# Patient Record
Sex: Female | Born: 1985
Health system: Southern US, Community
[De-identification: ages and names within clinical notes are randomized; demographics above are authoritative.]

## PROBLEM LIST (undated history)

## (undated) ENCOUNTER — Inpatient Hospital Stay (HOSPITAL_COMMUNITY): Payer: Self-pay

## (undated) DIAGNOSIS — R51 Headache: Secondary | ICD-10-CM

## (undated) DIAGNOSIS — D62 Acute posthemorrhagic anemia: Secondary | ICD-10-CM

## (undated) DIAGNOSIS — K589 Irritable bowel syndrome without diarrhea: Secondary | ICD-10-CM

## (undated) DIAGNOSIS — Z789 Other specified health status: Secondary | ICD-10-CM

## (undated) DIAGNOSIS — R519 Headache, unspecified: Secondary | ICD-10-CM

## (undated) HISTORY — DX: Headache: R51

## (undated) HISTORY — DX: Headache, unspecified: R51.9

## (undated) HISTORY — PX: NO PAST SURGERIES: SHX2092

## (undated) HISTORY — DX: Irritable bowel syndrome without diarrhea: K58.9

---

## 2005-05-16 ENCOUNTER — Encounter: Admission: RE | Admit: 2005-05-16 | Discharge: 2005-05-16 | Payer: Self-pay | Admitting: Gastroenterology

## 2013-01-21 ENCOUNTER — Ambulatory Visit: Payer: Self-pay | Admitting: Specialist

## 2015-04-10 LAB — OB RESULTS CONSOLE ANTIBODY SCREEN: Antibody Screen: NEGATIVE

## 2015-04-10 LAB — OB RESULTS CONSOLE RPR: RPR: NONREACTIVE

## 2015-04-10 LAB — OB RESULTS CONSOLE ABO/RH: RH TYPE: POSITIVE

## 2015-04-10 LAB — OB RESULTS CONSOLE RUBELLA ANTIBODY, IGM
RUBELLA: IMMUNE
Rubella: IMMUNE
Rubella: NON-IMMUNE/NOT IMMUNE

## 2015-04-10 LAB — OB RESULTS CONSOLE HEPATITIS B SURFACE ANTIGEN: Hepatitis B Surface Ag: NEGATIVE

## 2015-04-10 LAB — OB RESULTS CONSOLE HIV ANTIBODY (ROUTINE TESTING): HIV: NONREACTIVE

## 2015-04-14 LAB — OB RESULTS CONSOLE GC/CHLAMYDIA
Chlamydia: NEGATIVE
Gonorrhea: NEGATIVE

## 2015-10-07 LAB — OB RESULTS CONSOLE GBS: STREP GROUP B AG: NEGATIVE

## 2015-10-23 ENCOUNTER — Inpatient Hospital Stay (HOSPITAL_COMMUNITY)
Admission: AD | Admit: 2015-10-23 | Discharge: 2015-10-24 | DRG: 775 | Disposition: A | Discharge status: Home / Self Care | Payer: BLUE CROSS/BLUE SHIELD | Source: Ambulatory Visit | Attending: Obstetrics and Gynecology | Admitting: Obstetrics and Gynecology

## 2015-10-23 ENCOUNTER — Encounter (HOSPITAL_COMMUNITY): Payer: Self-pay

## 2015-10-23 DIAGNOSIS — Z3A38 38 weeks gestation of pregnancy: Secondary | ICD-10-CM

## 2015-10-23 DIAGNOSIS — D6959 Other secondary thrombocytopenia: Secondary | ICD-10-CM | POA: Diagnosis present

## 2015-10-23 DIAGNOSIS — O9081 Anemia of the puerperium: Secondary | ICD-10-CM | POA: Diagnosis present

## 2015-10-23 DIAGNOSIS — O9912 Other diseases of the blood and blood-forming organs and certain disorders involving the immune mechanism complicating childbirth: Principal | ICD-10-CM | POA: Diagnosis present

## 2015-10-23 DIAGNOSIS — IMO0001 Reserved for inherently not codable concepts without codable children: Secondary | ICD-10-CM

## 2015-10-23 DIAGNOSIS — D62 Acute posthemorrhagic anemia: Secondary | ICD-10-CM | POA: Diagnosis not present

## 2015-10-23 DIAGNOSIS — O99119 Other diseases of the blood and blood-forming organs and certain disorders involving the immune mechanism complicating pregnancy, unspecified trimester: Secondary | ICD-10-CM | POA: Diagnosis present

## 2015-10-23 DIAGNOSIS — D696 Thrombocytopenia, unspecified: Secondary | ICD-10-CM | POA: Diagnosis present

## 2015-10-23 DIAGNOSIS — D509 Iron deficiency anemia, unspecified: Secondary | ICD-10-CM | POA: Diagnosis present

## 2015-10-23 DIAGNOSIS — O99019 Anemia complicating pregnancy, unspecified trimester: Secondary | ICD-10-CM

## 2015-10-23 LAB — CBC
HEMATOCRIT: 33.1 % — AB (ref 36.0–46.0)
Hemoglobin: 10.6 g/dL — ABNORMAL LOW (ref 12.0–15.0)
MCH: 29.1 pg (ref 26.0–34.0)
MCHC: 32 g/dL (ref 30.0–36.0)
MCV: 90.9 fL (ref 78.0–100.0)
Platelets: 136 10*3/uL — ABNORMAL LOW (ref 150–400)
RBC: 3.64 MIL/uL — AB (ref 3.87–5.11)
RDW: 13.6 % (ref 11.5–15.5)
WBC: 6.5 10*3/uL (ref 4.0–10.5)

## 2015-10-23 LAB — RPR: RPR Ser Ql: NONREACTIVE

## 2015-10-23 MED ORDER — ONDANSETRON HCL 4 MG PO TABS: 4.0000 mg | ORAL_TABLET | ORAL | Status: DC | PRN

## 2015-10-23 MED ORDER — LACTATED RINGERS IV SOLN: INTRAVENOUS | Status: DC

## 2015-10-23 MED ORDER — OXYTOCIN 40 UNITS IN LACTATED RINGERS INFUSION - SIMPLE MED
INTRAVENOUS | Status: AC
  Filled 2015-10-23: qty 1000

## 2015-10-23 MED ORDER — OXYTOCIN 40 UNITS IN LACTATED RINGERS INFUSION - SIMPLE MED: 62.5000 mL/h | INTRAVENOUS | Status: DC

## 2015-10-23 MED ORDER — FERROUS SULFATE 325 (65 FE) MG PO TABS
325.0000 mg | ORAL_TABLET | Freq: Two times a day (BID) | ORAL | Status: DC
  Administered 2015-10-23 – 2015-10-24 (×3): 325 mg via ORAL
  Filled 2015-10-23 (×3): qty 1

## 2015-10-23 MED ORDER — PRENATAL MULTIVITAMIN CH
1.0000 | ORAL_TABLET | Freq: Every day | ORAL | Status: DC
  Administered 2015-10-23 – 2015-10-24 (×2): 1 via ORAL
  Filled 2015-10-23 (×2): qty 1

## 2015-10-23 MED ORDER — BUTORPHANOL TARTRATE 1 MG/ML IJ SOLN: 1.0000 mg | INTRAMUSCULAR | Status: DC | PRN

## 2015-10-23 MED ORDER — OXYCODONE-ACETAMINOPHEN 5-325 MG PO TABS: 1.0000 | ORAL_TABLET | ORAL | Status: DC | PRN

## 2015-10-23 MED ORDER — DIPHENHYDRAMINE HCL 25 MG PO CAPS: 25.0000 mg | ORAL_CAPSULE | Freq: Four times a day (QID) | ORAL | Status: DC | PRN

## 2015-10-23 MED ORDER — WITCH HAZEL-GLYCERIN EX PADS: 1.0000 "application " | MEDICATED_PAD | CUTANEOUS | Status: DC | PRN

## 2015-10-23 MED ORDER — OXYTOCIN 10 UNIT/ML IJ SOLN: 10.0000 [IU] | Freq: Once | INTRAMUSCULAR | Status: DC

## 2015-10-23 MED ORDER — DIBUCAINE 1 % RE OINT: 1.0000 "application " | TOPICAL_OINTMENT | RECTAL | Status: DC | PRN

## 2015-10-23 MED ORDER — ONDANSETRON HCL 4 MG/2ML IJ SOLN: 4.0000 mg | INTRAMUSCULAR | Status: DC | PRN

## 2015-10-23 MED ORDER — OXYTOCIN BOLUS FROM INFUSION: 500.0000 mL | INTRAVENOUS | Status: DC

## 2015-10-23 MED ORDER — OXYCODONE-ACETAMINOPHEN 5-325 MG PO TABS: 2.0000 | ORAL_TABLET | ORAL | Status: DC | PRN

## 2015-10-23 MED ORDER — ACETAMINOPHEN 325 MG PO TABS: 650.0000 mg | ORAL_TABLET | ORAL | Status: DC | PRN

## 2015-10-23 MED ORDER — BENZOCAINE-MENTHOL 20-0.5 % EX AERO: 1.0000 "application " | INHALATION_SPRAY | CUTANEOUS | Status: DC | PRN

## 2015-10-23 MED ORDER — SENNOSIDES-DOCUSATE SODIUM 8.6-50 MG PO TABS
2.0000 | ORAL_TABLET | ORAL | Status: DC
  Administered 2015-10-23: 2 via ORAL
  Filled 2015-10-23: qty 2

## 2015-10-23 MED ORDER — IBUPROFEN 600 MG PO TABS
600.0000 mg | ORAL_TABLET | Freq: Four times a day (QID) | ORAL | Status: DC
  Administered 2015-10-23 (×4): 600 mg via ORAL
  Filled 2015-10-23 (×6): qty 1

## 2015-10-23 MED ORDER — SIMETHICONE 80 MG PO CHEW: 80.0000 mg | CHEWABLE_TABLET | ORAL | Status: DC | PRN

## 2015-10-23 MED ORDER — CITRIC ACID-SODIUM CITRATE 334-500 MG/5ML PO SOLN: 30.0000 mL | ORAL | Status: DC | PRN

## 2015-10-23 MED ORDER — ZOLPIDEM TARTRATE 5 MG PO TABS: 5.0000 mg | ORAL_TABLET | Freq: Every evening | ORAL | Status: DC | PRN

## 2015-10-23 MED ORDER — LACTATED RINGERS IV SOLN: 500.0000 mL | INTRAVENOUS | Status: DC | PRN

## 2015-10-23 MED ORDER — LIDOCAINE HCL (PF) 1 % IJ SOLN
INTRAMUSCULAR | Status: AC
  Filled 2015-10-23: qty 30

## 2015-10-23 MED ORDER — LANOLIN HYDROUS EX OINT: TOPICAL_OINTMENT | CUTANEOUS | Status: DC | PRN

## 2015-10-23 MED ORDER — ONDANSETRON HCL 4 MG/2ML IJ SOLN: 4.0000 mg | Freq: Four times a day (QID) | INTRAMUSCULAR | Status: DC | PRN

## 2015-10-23 MED ORDER — LIDOCAINE HCL (PF) 1 % IJ SOLN
30.0000 mL | INTRAMUSCULAR | Status: DC | PRN
  Filled 2015-10-23: qty 30

## 2015-10-23 NOTE — Progress Notes (Signed)
S; notes ctx Rectal pressure  O:  AROM clear fluid VE 8/100/+1  Tracing: baseline 135 (+) accel Ctx q 23mins  IMP: active phase P) cont present mgmt.

## 2015-10-23 NOTE — H&P (Signed)
Lauren Ingram is a 29 y.o. female presenting for active labor. Intact membrane Maternal Medical History:  Reason for admission: Contractions.   Contractions: Onset was 6-12 hours ago.   Frequency: regular.   Perceived severity is moderate.    Fetal activity: Perceived fetal activity is normal.    Prenatal complications: Thrombocytopenia.     OB History    Gravida Para Term Preterm AB TAB SAB Ectopic Multiple Living   5 2 2  0 2 0 2 0 0 2     History reviewed. No pertinent past medical history. History reviewed. No pertinent past surgical history. Family History: family history is not on file. Social History:  reports that she has never smoked. She does not have any smokeless tobacco history on file. Her alcohol and drug histories are not on file.   Prenatal Transfer Tool  Maternal Diabetes: No Genetic Screening: Declined Maternal Ultrasounds/Referrals: Normal Fetal Ultrasounds or other Referrals:  None Maternal Substance Abuse:  No Significant Maternal Medications:  None Significant Maternal Lab Results:  Lab values include: Group B Strep negative Other Comments: gestational thrombocytopenia  Review of Systems  All other systems reviewed and are negative.   Dilation: 5 Effacement (%): 90 Station: 0 Exam by:: Dr Garwin Brothers Blood pressure 118/74, pulse 94, temperature 98 F (36.7 C), temperature source Oral, resp. rate 16, height 5\' 7"  (1.702 m), weight 60.328 kg (133 lb). Maternal Exam:  Uterine Assessment: Contraction strength is moderate.  Contraction frequency is irregular.   Abdomen: Patient reports no abdominal tenderness. Estimated fetal weight is 7lb.   Fetal presentation: vertex  Introitus: Vulva is positive for vulvar varicosities. Amniotic fluid character: not assessed.  Pelvis: adequate for delivery.   Cervix: Cervix evaluated by digital exam.     Physical Exam  Vitals reviewed. Constitutional: She is oriented to person, place, and time. She appears  well-developed and well-nourished.  HENT:  Head: Atraumatic.  Eyes: EOM are normal.  Neck: Neck supple.  Cardiovascular: Normal rate.   Respiratory: Effort normal.  GI: Soft.  Musculoskeletal: She exhibits no edema.  Neurological: She is alert and oriented to person, place, and time.  Skin: Skin is warm and dry.    Prenatal labs: ABO, Rh:  A positive Antibody:  negative Rubella:  Immune RPR:   NR HBsAg:   neg HIV:   neg GBS:   neg  Assessment/Plan: Active labor  hx Gestational thrombocytopenia Term gestation P) admit routine labs. Epidural prn. amniotomy   Zareya Tuckett A 10/23/2015, 1:32 AM

## 2015-10-23 NOTE — MAU Note (Signed)
Pt c/o contractions 5-7 mins apart for several hours now. Denies LOF but having bloody show. Was 1cm in office on Tuesday. +FM

## 2015-10-24 DIAGNOSIS — D62 Acute posthemorrhagic anemia: Secondary | ICD-10-CM | POA: Diagnosis not present

## 2015-10-24 DIAGNOSIS — D509 Iron deficiency anemia, unspecified: Secondary | ICD-10-CM | POA: Diagnosis present

## 2015-10-24 DIAGNOSIS — O99019 Anemia complicating pregnancy, unspecified trimester: Secondary | ICD-10-CM

## 2015-10-24 LAB — CBC
HEMATOCRIT: 26.8 % — AB (ref 36.0–46.0)
HEMOGLOBIN: 8.6 g/dL — AB (ref 12.0–15.0)
MCH: 29.6 pg (ref 26.0–34.0)
MCHC: 32.1 g/dL (ref 30.0–36.0)
MCV: 92.1 fL (ref 78.0–100.0)
Platelets: 122 10*3/uL — ABNORMAL LOW (ref 150–400)
RBC: 2.91 MIL/uL — AB (ref 3.87–5.11)
RDW: 13.9 % (ref 11.5–15.5)
WBC: 4.7 10*3/uL (ref 4.0–10.5)

## 2015-10-24 MED ORDER — FERROUS SULFATE 325 (65 FE) MG PO TABS: 325.0000 mg | ORAL_TABLET | Freq: Every day | ORAL | Status: DC

## 2015-10-24 MED ORDER — IBUPROFEN 600 MG PO TABS: 600.0000 mg | ORAL_TABLET | Freq: Four times a day (QID) | ORAL | Status: DC

## 2015-10-24 NOTE — Lactation Note (Signed)
This note was copied from the chart of Lauren Tyahna Shover. Lactation Consultation Note  Mom states breastfeeding is going very well.  This is her third baby and she nursed previous babies.  Lactation outpatient services and support encouraged prn.  Patient Name: Lauren Ingram [id number] Date: 10/24/2015     Maternal Data    Feeding Feeding Type: Breast Fed  LATCH Score/Interventions Latch: Grasps breast easily, tongue down, lips flanged, rhythmical sucking.  Audible Swallowing: A few with stimulation Intervention(s): Hand expression  Type of Nipple: Everted at rest and after stimulation  Comfort (Breast/Nipple): Soft / non-tender     Hold (Positioning): No assistance needed to correctly position infant at breast.  LATCH Score: 9  Lactation Tools Discussed/Used     Consult Status      Lauren Ingram 10/24/2015, 11:03 AM

## 2015-10-24 NOTE — Discharge Summary (Signed)
Obstetric Discharge Summary Reason for Admission: onset of labor and 38.[redacted] weeks gestation Prenatal Course: gestational thrombocytopenia Intrapartum Procedures: spontaneous vaginal delivery and AROM-clear Postpartum Procedures: none Complications-Operative and Postpartum: ABL anemia HEMOGLOBIN  Date Value Ref Range Status  10/24/2015 8.6* 12.0 - 15.0 g/dL Final   HCT  Date Value Ref Range Status  10/24/2015 26.8* 36.0 - 46.0 % Final    Physical Exam:  General: alert, cooperative and no distress Lochia: appropriate Uterine Fundus: firm Perineum: intact, no edema or bruising DVT Evaluation: No evidence of DVT seen on physical exam. Negative Homan's sign. No cords or calf tenderness. No significant calf/ankle edema.  Discharge Diagnoses: Term Pregnancy-delivered, IDA with compounding ABL anemia  Discharge Information: Date: 10/24/2015 Activity: pelvic rest Diet: routine Medications: PNV, Ibuprofen and Iron Condition: stable Instructions: refer to practice specific booklet Discharge to: home Follow-up Information    Follow up with LaurenSHERONETTE A, MD. Schedule an appointment as soon as possible for Ingram visit in 6 weeks.   Specialty:  Obstetrics and Gynecology   Contact information:   [address] [name] [address] [phone]       Newborn Data: Live born female on 10/23/15 Birth Weight: 6 lb 15.5 oz (3160 g) APGAR: 8, 9  Home with mother.  Lauren Ingram, [place], [place] 10/24/2015, 10:35 AM

## 2015-10-24 NOTE — Progress Notes (Addendum)
PPD #1- SVD  Subjective:   Reports feeling well, desires early discharge Tolerating po/ No nausea or vomiting Bleeding is light Pain controlled with none Up ad lib / ambulatory / voiding without problems Newborn: breastfeeding    Objective:   VS: VS:  Filed Vitals:   10/23/15 0648 10/23/15 1031 10/23/15 1815 10/24/15 0610  BP: 93/57 92/56 99/65  87/51  Pulse: 53 49 67 52  Temp:  97.8 F (36.6 C) 97.9 F (36.6 C) 97.5 F (36.4 C)  TempSrc:  Oral Oral   Resp: 16 18 18 16   Height:      Weight:        LABS:  Recent Labs  10/23/15 0150 10/24/15 0600  WBC 6.5 4.7  HGB 10.6* 8.6*  PLT 136* 122*   Blood type: A/Positive/-- (06/10 0000) Rubella: Immune               I&O: Intake/Output      12/23 0701 - 12/24 0700 12/24 0701 - 12/25 0700   Urine (mL/kg/hr)     Blood     Total Output       Net              Physical Exam: Alert and oriented X3 Abdomen: soft, non-tender, non-distended  Fundus: firm, non-tender, U-4 Perineum: intact, no edema or erythema Lochia: small Extremities: No edema, no calf pain or tenderness   Assessment: PPD #1  [id number]/ S/P:spontaneous vaginal IDA with compounding ABL anemia Gestational thrombocytopenia, delivered-stable Doing well - stable for discharge home  Plan: Discharge home RX's:  Ibuprofen 600mg  po Q 6 hrs prn pain #30 Refill x 0 Niferex 150mg  po QD #30 Refill x 1 Follow up in 6 wks at [facility] for postpartum visit [place] given    Julianne Handler, N MSN, CNM 10/24/2015, 10:32 AM

## 2016-08-25 ENCOUNTER — Encounter: Payer: Self-pay | Admitting: Gastroenterology

## 2016-09-02 ENCOUNTER — Encounter: Payer: Self-pay | Admitting: Gastroenterology

## 2016-09-02 ENCOUNTER — Ambulatory Visit (INDEPENDENT_AMBULATORY_CARE_PROVIDER_SITE_OTHER): Payer: BLUE CROSS/BLUE SHIELD | Admitting: Gastroenterology

## 2016-09-02 ENCOUNTER — Other Ambulatory Visit (INDEPENDENT_AMBULATORY_CARE_PROVIDER_SITE_OTHER): Payer: BLUE CROSS/BLUE SHIELD

## 2016-09-02 VITALS — BP 106/68 | HR 56 | Ht 68.0 in | Wt 136.0 lb

## 2016-09-02 DIAGNOSIS — R1011 Right upper quadrant pain: Secondary | ICD-10-CM

## 2016-09-02 DIAGNOSIS — R140 Abdominal distension (gaseous): Secondary | ICD-10-CM | POA: Diagnosis not present

## 2016-09-02 DIAGNOSIS — K589 Irritable bowel syndrome without diarrhea: Secondary | ICD-10-CM

## 2016-09-02 LAB — HEPATIC FUNCTION PANEL
ALK PHOS: 71 U/L (ref 39–117)
ALT: 12 U/L (ref 0–35)
AST: 14 U/L (ref 0–37)
Albumin: 4.6 g/dL (ref 3.5–5.2)
BILIRUBIN TOTAL: 0.4 mg/dL (ref 0.2–1.2)
Bilirubin, Direct: 0.1 mg/dL (ref 0.0–0.3)
Total Protein: 7.1 g/dL (ref 6.0–8.3)

## 2016-09-02 LAB — IGA: IGA: 170 mg/dL (ref 68–378)

## 2016-09-02 NOTE — Patient Instructions (Signed)
If you are age 32 or older, your body mass index should be between 23-30. Your Body mass index is 20.68 kg/m. If this is out of the aforementioned range listed, please consider follow up with your Primary Care Provider.  If you are age 31 or younger, your body mass index should be between 19-25. Your Body mass index is 20.68 kg/m. If this is out of the aformentioned range listed, please consider follow up with your Primary Care Provider.   Your physician has requested that you go to the basement for lab work before leaving today.  We have given you a Low FODMAP Diet.  You have been scheduled for an abdominal ultrasound at [facility] (1st floor of hospital) on Tuesday November 7th at 8:30am. Please arrive 15 minutes prior to your appointment for registration. Make certain not to have anything to after midnight the night before your appointment. Should you need to reschedule your appointment, please contact radiology at [phone]. This test typically takes about 30 minutes to perform.

## 2016-09-02 NOTE — Progress Notes (Signed)
HPI :  30  Y/o female with history of reported IBS, here for evaluation for RUQ abdominal pain.   Patient reports pain more so since October, although previously has bothered her intermittently.  Pain in the RUQ and can radiate into her shoulder blade and mid back, and epigastric area. She reports she typically will have pain after she eats, but has occurred without eating. She has pain within 1 hour after eating, usually at night, she will feel it. She reports pain usually resolves within an hour or two, but the pain in her back can last a bit longer. Pain can be severe. She reports she can feel this on most days of the week, intense pain about 1-2 times per week. She tries to avoid eating any fatty or greasy foods. Nuts in particular will cause her to feel terrible. She has a lot of bloating / gas after she eats, normally eats a lot of vegetables. No vomiting routinely, but some nausea. No weight loss. No FH of cancer.  Mother had history of gallstones.  She normally has loose stools. She reports roughly 1-2 BMs per day or so. NO blood in the stools. Symptoms stable over time for years. She previously had IBS-C when in college. She is dairy free / grain free which has helped.    Past Medical History:  Diagnosis Date  . IBS (irritable bowel syndrome)   . Postpartum care following vaginal delivery (12/23) 10/23/2015     History reviewed. No pertinent surgical history. Family History  Problem Relation Age of Onset  . Heart disease Maternal Grandfather   . Heart disease Paternal Grandfather    Social History  Substance Use Topics  . Smoking status: Never Smoker  . Smokeless tobacco: Never Used  . Alcohol use No   No current outpatient prescriptions on file.   No current facility-administered medications for this visit.    No Known Allergies   Review of Systems: All systems reviewed and negative except where noted in HPI.   Labs reviewed - history of anemia during  pregnancy  Physical Exam: BP 106/68   Pulse (!) 56   Ht 5\' 8"  (1.727 m)   Wt 136 lb (61.7 kg)   BMI 20.68 kg/m  Constitutional: Pleasant,well-developed, female in no acute distress. HEENT: Normocephalic and atraumatic. Conjunctivae are normal. No scleral icterus. Neck supple.  Cardiovascular: Normal rate, regular rhythm.  Pulmonary/chest: Effort normal and breath sounds normal. No wheezing, rales or rhonchi. Abdominal: Soft, nondistended, mild RUQ TTP, no rebound or guarding. There are no masses palpable. No hepatomegaly. Extremities: no edema Lymphadenopathy: No cervical adenopathy noted. Neurological: Alert and oriented to person place and time. Skin: Skin is warm and dry. No rashes noted. Psychiatric: Normal mood and affect. Behavior is normal.   ASSESSMENT AND PLAN: 30 year old female with history of suspected irritable bowel syndrome presenting with a month's worth of persistent postprandial right upper quadrant discomfort with radiation to the shoulder blade, concerning for biliary colic/gallstones. Recommend ultrasound of the right upper quadrant to initially evaluate this symptom. If gallstones are noted on this exam we will have her evaluated by general surgery for consideration of cholecystectomy. In the interim will obtain LFTs to ensure normal.  Otherwise she has some chronic bloating and intermittent loose stools. I suspect this is more than likely IBS and perhaps related to her diet. We'll send TTG to assess for celiac disease. I otherwise counseled her on a low FODMAP diet and recommend she try this initially to  see if it helps.   All questions answered we will let her know the results of her tests  [place] Cellar, MD [facility] Pager [phone]

## 2016-09-05 LAB — TISSUE TRANSGLUTAMINASE, IGA: TISSUE TRANSGLUTAMINASE AB, IGA: 1 U/mL (ref <4)

## 2016-09-06 ENCOUNTER — Other Ambulatory Visit: Payer: Self-pay

## 2016-09-06 ENCOUNTER — Encounter: Payer: Self-pay | Admitting: Gastroenterology

## 2016-09-06 ENCOUNTER — Ambulatory Visit (HOSPITAL_COMMUNITY): Payer: BLUE CROSS/BLUE SHIELD

## 2016-09-06 NOTE — Progress Notes (Signed)
Letter mailed

## 2016-09-08 ENCOUNTER — Other Ambulatory Visit: Payer: Self-pay

## 2016-09-08 ENCOUNTER — Telehealth: Payer: Self-pay

## 2016-09-08 ENCOUNTER — Ambulatory Visit
Admission: RE | Admit: 2016-09-08 | Discharge: 2016-09-08 | Disposition: A | Discharge status: Home / Self Care | Payer: BLUE CROSS/BLUE SHIELD | Source: Ambulatory Visit | Attending: Gastroenterology | Admitting: Gastroenterology

## 2016-09-08 DIAGNOSIS — R140 Abdominal distension (gaseous): Secondary | ICD-10-CM

## 2016-09-08 DIAGNOSIS — K589 Irritable bowel syndrome without diarrhea: Secondary | ICD-10-CM | POA: Diagnosis present

## 2016-09-08 DIAGNOSIS — R1011 Right upper quadrant pain: Secondary | ICD-10-CM

## 2016-09-08 MED ORDER — OMEPRAZOLE 40 MG PO CPDR: 40.0000 mg | DELAYED_RELEASE_CAPSULE | Freq: Every day | ORAL | 0 refills | Status: DC

## 2016-09-08 NOTE — Telephone Encounter (Signed)
Patient called back, she is still breast feeding. According to radiology she will need to pump and dump for a full 24 hours after the test. She currently does not have enough milk stored, so patient will call scheduling to reschedule into December.

## 2016-09-15 ENCOUNTER — Ambulatory Visit (HOSPITAL_COMMUNITY): Payer: BLUE CROSS/BLUE SHIELD

## 2016-10-19 ENCOUNTER — Ambulatory Visit (HOSPITAL_COMMUNITY): Payer: BLUE CROSS/BLUE SHIELD

## 2016-11-18 ENCOUNTER — Other Ambulatory Visit: Payer: Self-pay

## 2016-11-18 ENCOUNTER — Telehealth: Payer: Self-pay | Admitting: Gastroenterology

## 2016-11-18 DIAGNOSIS — D509 Iron deficiency anemia, unspecified: Secondary | ICD-10-CM

## 2016-11-18 NOTE — Telephone Encounter (Signed)
Patient returning Julie's call °

## 2016-11-21 ENCOUNTER — Other Ambulatory Visit: Payer: Self-pay

## 2016-11-21 ENCOUNTER — Other Ambulatory Visit
Admission: RE | Admit: 2016-11-21 | Discharge: 2016-11-21 | Disposition: A | Discharge status: Home / Self Care | Payer: BLUE CROSS/BLUE SHIELD | Source: Ambulatory Visit | Attending: Gastroenterology | Admitting: Gastroenterology

## 2016-11-21 DIAGNOSIS — D509 Iron deficiency anemia, unspecified: Secondary | ICD-10-CM | POA: Insufficient documentation

## 2016-11-21 LAB — PREGNANCY, URINE: Preg Test, Ur: NEGATIVE

## 2016-11-21 NOTE — Telephone Encounter (Signed)
Spoke to patient, she will go to [facility] today to their outpatient lab to get a urine pregnancy test. Patient does have phone number to radiology scheduling if it is needed and she has to reschedule.

## 2016-11-21 NOTE — Addendum Note (Signed)
Addended by: Doristine Counter on: 11/21/2016 08:36 AM   Modules accepted: Orders

## 2016-11-22 ENCOUNTER — Encounter
Admission: RE | Admit: 2016-11-22 | Discharge: 2016-11-22 | Disposition: A | Discharge status: Home / Self Care | Payer: BLUE CROSS/BLUE SHIELD | Source: Ambulatory Visit | Attending: Gastroenterology | Admitting: Gastroenterology

## 2016-11-22 ENCOUNTER — Telehealth: Payer: Self-pay

## 2016-11-22 DIAGNOSIS — R1011 Right upper quadrant pain: Secondary | ICD-10-CM | POA: Diagnosis present

## 2016-11-22 MED ORDER — TECHNETIUM TC 99M MEBROFENIN IV KIT
5.0000 | PACK | Freq: Once | INTRAVENOUS | Status: AC | PRN
  Administered 2016-11-22: 4.95 via INTRAVENOUS

## 2016-11-22 NOTE — Telephone Encounter (Signed)
-----   Message from Manus Gunning, MD sent at 11/22/2016 12:41 PM EST ----- Caryl Pina can you let this patient know her HIDA scan was normal. Her [place] also did not show gallstones. This would argue against her gallbladder causing her symptoms, although it remains possible.   Has she had any benefit on the omeprazole at all, or have her symptoms persisted? If she is still having pain after she eats despite omeprazole, I would offer her an EGD to further evaluate. If symptoms are controlled with omeprazole she should continue that for now and can hold off on EGD. Can you let me know how she wishes to proceed? Thanks

## 2016-11-22 NOTE — Telephone Encounter (Signed)
Informed pt of normal scan. She states that she continues to have pain in RUQ and right back and that she was having pain during the scan as well. I offered the EGD however pt does not want to have the procedure at this time. She will continue Omeprazole and call with concerns.

## 2016-11-22 NOTE — Telephone Encounter (Signed)
-----   Message from Manus Gunning, MD sent at 11/21/2016  4:58 PM EST ----- Yes, negative, she can proceed. Thanks  ----- Message ----- From: Doristine Counter, RN Sent: 11/21/2016   4:50 PM To: Manus Gunning, MD  Hi, she just called for her pregnancy test results, saw that they are negative. She has a HIDA scan tomorrow and needed to find out if she should go or reschedule. Thanks.

## 2016-11-22 NOTE — Telephone Encounter (Signed)
Patient advised of negative test results, okay to proceed with HIDA.

## 2017-03-08 DIAGNOSIS — Z3A01 Less than 8 weeks gestation of pregnancy: Secondary | ICD-10-CM | POA: Diagnosis not present

## 2017-03-08 DIAGNOSIS — O09291 Supervision of pregnancy with other poor reproductive or obstetric history, first trimester: Secondary | ICD-10-CM | POA: Diagnosis not present

## 2017-03-08 DIAGNOSIS — Z3201 Encounter for pregnancy test, result positive: Secondary | ICD-10-CM | POA: Diagnosis not present

## 2017-03-13 DIAGNOSIS — Z3201 Encounter for pregnancy test, result positive: Secondary | ICD-10-CM | POA: Diagnosis not present

## 2017-03-22 DIAGNOSIS — Z3201 Encounter for pregnancy test, result positive: Secondary | ICD-10-CM | POA: Diagnosis not present

## 2017-03-23 DIAGNOSIS — Z3A01 Less than 8 weeks gestation of pregnancy: Secondary | ICD-10-CM | POA: Diagnosis not present

## 2017-03-23 DIAGNOSIS — Z3689 Encounter for other specified antenatal screening: Secondary | ICD-10-CM | POA: Diagnosis not present

## 2017-03-23 DIAGNOSIS — Z3481 Encounter for supervision of other normal pregnancy, first trimester: Secondary | ICD-10-CM | POA: Diagnosis not present

## 2017-03-23 DIAGNOSIS — O09291 Supervision of pregnancy with other poor reproductive or obstetric history, first trimester: Secondary | ICD-10-CM | POA: Diagnosis not present

## 2017-04-13 DIAGNOSIS — O09291 Supervision of pregnancy with other poor reproductive or obstetric history, first trimester: Secondary | ICD-10-CM | POA: Diagnosis not present

## 2017-04-13 DIAGNOSIS — Z118 Encounter for screening for other infectious and parasitic diseases: Secondary | ICD-10-CM | POA: Diagnosis not present

## 2017-04-13 DIAGNOSIS — Z3A11 11 weeks gestation of pregnancy: Secondary | ICD-10-CM | POA: Diagnosis not present

## 2017-04-24 LAB — OB RESULTS CONSOLE GC/CHLAMYDIA
Chlamydia: NEGATIVE
Gonorrhea: NEGATIVE

## 2017-04-24 LAB — OB RESULTS CONSOLE RPR: RPR: NONREACTIVE

## 2017-04-24 LAB — OB RESULTS CONSOLE HEPATITIS B SURFACE ANTIGEN: HEP B S AG: NEGATIVE

## 2017-04-24 LAB — OB RESULTS CONSOLE HIV ANTIBODY (ROUTINE TESTING): HIV: NONREACTIVE

## 2017-04-24 LAB — OB RESULTS CONSOLE ABO/RH: RH TYPE: POSITIVE

## 2017-04-24 LAB — OB RESULTS CONSOLE ANTIBODY SCREEN: Antibody Screen: NEGATIVE

## 2017-04-24 LAB — OB RESULTS CONSOLE RUBELLA ANTIBODY, IGM: RUBELLA: IMMUNE

## 2017-05-09 DIAGNOSIS — Z3A14 14 weeks gestation of pregnancy: Secondary | ICD-10-CM | POA: Diagnosis not present

## 2017-05-09 DIAGNOSIS — O09292 Supervision of pregnancy with other poor reproductive or obstetric history, second trimester: Secondary | ICD-10-CM | POA: Diagnosis not present

## 2017-05-22 DIAGNOSIS — Z3A16 16 weeks gestation of pregnancy: Secondary | ICD-10-CM | POA: Diagnosis not present

## 2017-05-22 DIAGNOSIS — R350 Frequency of micturition: Secondary | ICD-10-CM | POA: Diagnosis not present

## 2017-05-22 DIAGNOSIS — O09292 Supervision of pregnancy with other poor reproductive or obstetric history, second trimester: Secondary | ICD-10-CM | POA: Diagnosis not present

## 2017-06-07 DIAGNOSIS — Z363 Encounter for antenatal screening for malformations: Secondary | ICD-10-CM | POA: Diagnosis not present

## 2017-06-07 DIAGNOSIS — Z3A18 18 weeks gestation of pregnancy: Secondary | ICD-10-CM | POA: Diagnosis not present

## 2017-06-07 DIAGNOSIS — O4402 Placenta previa specified as without hemorrhage, second trimester: Secondary | ICD-10-CM | POA: Diagnosis not present

## 2017-07-05 DIAGNOSIS — O4402 Placenta previa specified as without hemorrhage, second trimester: Secondary | ICD-10-CM | POA: Diagnosis not present

## 2017-07-05 DIAGNOSIS — Z3A22 22 weeks gestation of pregnancy: Secondary | ICD-10-CM | POA: Diagnosis not present

## 2017-07-26 DIAGNOSIS — O4402 Placenta previa specified as without hemorrhage, second trimester: Secondary | ICD-10-CM | POA: Diagnosis not present

## 2017-07-26 DIAGNOSIS — Z3A25 25 weeks gestation of pregnancy: Secondary | ICD-10-CM | POA: Diagnosis not present

## 2017-08-17 DIAGNOSIS — O4403 Placenta previa specified as without hemorrhage, third trimester: Secondary | ICD-10-CM | POA: Diagnosis not present

## 2017-08-17 DIAGNOSIS — Z23 Encounter for immunization: Secondary | ICD-10-CM | POA: Diagnosis not present

## 2017-08-17 DIAGNOSIS — Z3689 Encounter for other specified antenatal screening: Secondary | ICD-10-CM | POA: Diagnosis not present

## 2017-08-17 DIAGNOSIS — Z3A28 28 weeks gestation of pregnancy: Secondary | ICD-10-CM | POA: Diagnosis not present

## 2017-09-05 ENCOUNTER — Inpatient Hospital Stay (HOSPITAL_COMMUNITY)
Admission: AD | Admit: 2017-09-05 | Discharge: 2017-09-08 | DRG: 833 | Disposition: A | Discharge status: Home / Self Care | Payer: BLUE CROSS/BLUE SHIELD | Source: Ambulatory Visit | Attending: Obstetrics and Gynecology | Admitting: Obstetrics and Gynecology

## 2017-09-05 ENCOUNTER — Encounter (HOSPITAL_COMMUNITY): Payer: Self-pay | Admitting: *Deleted

## 2017-09-05 ENCOUNTER — Other Ambulatory Visit: Payer: Self-pay

## 2017-09-05 ENCOUNTER — Inpatient Hospital Stay (HOSPITAL_COMMUNITY): Payer: BLUE CROSS/BLUE SHIELD

## 2017-09-05 DIAGNOSIS — O4413 Placenta previa with hemorrhage, third trimester: Principal | ICD-10-CM | POA: Diagnosis present

## 2017-09-05 DIAGNOSIS — O4410 Placenta previa with hemorrhage, unspecified trimester: Secondary | ICD-10-CM | POA: Diagnosis not present

## 2017-09-05 DIAGNOSIS — O4403 Placenta previa specified as without hemorrhage, third trimester: Secondary | ICD-10-CM | POA: Diagnosis not present

## 2017-09-05 DIAGNOSIS — Z3A31 31 weeks gestation of pregnancy: Secondary | ICD-10-CM

## 2017-09-05 HISTORY — DX: Other specified health status: Z78.9

## 2017-09-05 LAB — CBC
HEMATOCRIT: 30.6 % — AB (ref 36.0–46.0)
Hemoglobin: 10.2 g/dL — ABNORMAL LOW (ref 12.0–15.0)
MCH: 30.5 pg (ref 26.0–34.0)
MCHC: 33.3 g/dL (ref 30.0–36.0)
MCV: 91.6 fL (ref 78.0–100.0)
PLATELETS: 140 10*3/uL — AB (ref 150–400)
RBC: 3.34 MIL/uL — ABNORMAL LOW (ref 3.87–5.11)
RDW: 13.6 % (ref 11.5–15.5)
WBC: 6.8 10*3/uL (ref 4.0–10.5)

## 2017-09-05 LAB — ABO/RH: ABO/RH(D): A POS

## 2017-09-05 LAB — TYPE AND SCREEN
ABO/RH(D): A POS
Antibody Screen: NEGATIVE

## 2017-09-05 MED ORDER — BETAMETHASONE SOD PHOS & ACET 6 (3-3) MG/ML IJ SUSP
12.0000 mg | INTRAMUSCULAR | Status: AC
  Administered 2017-09-05 – 2017-09-06 (×2): 12 mg via INTRAMUSCULAR
  Filled 2017-09-05 (×2): qty 2

## 2017-09-05 MED ORDER — LACTATED RINGERS IV SOLN
INTRAVENOUS | Status: DC
  Administered 2017-09-05 – 2017-09-06 (×2): via INTRAVENOUS

## 2017-09-05 MED ORDER — DOCUSATE SODIUM 100 MG PO CAPS
100.0000 mg | ORAL_CAPSULE | Freq: Every day | ORAL | Status: DC
  Administered 2017-09-05 – 2017-09-08 (×4): 100 mg via ORAL
  Filled 2017-09-05 (×6): qty 1

## 2017-09-05 MED ORDER — MAGNESIUM SULFATE BOLUS VIA INFUSION
6.0000 g | Freq: Once | INTRAVENOUS | Status: AC
  Administered 2017-09-05: 6 g via INTRAVENOUS
  Filled 2017-09-05: qty 500

## 2017-09-05 MED ORDER — MAGNESIUM SULFATE 40 G IN LACTATED RINGERS - SIMPLE
2.0000 g/h | INTRAVENOUS | Status: DC
  Administered 2017-09-05: 2 g/h via INTRAVENOUS
  Filled 2017-09-05: qty 500

## 2017-09-05 MED ORDER — PRENATAL MULTIVITAMIN CH
1.0000 | ORAL_TABLET | Freq: Every day | ORAL | Status: DC
  Administered 2017-09-05 – 2017-09-08 (×4): 1 via ORAL
  Filled 2017-09-05 (×6): qty 1

## 2017-09-05 MED ORDER — BETAMETHASONE SOD PHOS & ACET 6 (3-3) MG/ML IJ SUSP
12.0000 mg | INTRAMUSCULAR | Status: DC
  Filled 2017-09-05 (×2): qty 2

## 2017-09-05 MED ORDER — LACTATED RINGERS IV SOLN
INTRAVENOUS | Status: DC
  Administered 2017-09-05: 13:00:00 via INTRAVENOUS

## 2017-09-05 MED ORDER — ACETAMINOPHEN 325 MG PO TABS
650.0000 mg | ORAL_TABLET | ORAL | Status: DC | PRN
  Administered 2017-09-06 (×2): 650 mg via ORAL
  Filled 2017-09-05 (×2): qty 2

## 2017-09-05 MED ORDER — CALCIUM CARBONATE ANTACID 500 MG PO CHEW: 2.0000 | CHEWABLE_TABLET | ORAL | Status: DC | PRN

## 2017-09-05 MED ORDER — ZOLPIDEM TARTRATE 5 MG PO TABS: 5.0000 mg | ORAL_TABLET | Freq: Every evening | ORAL | Status: DC | PRN

## 2017-09-05 NOTE — MAU Note (Signed)
Pt presents with c/o VB.  Pt reports she has a placenta previa with this pregnancy.  States has had back pain that worsened throughout the morning.Had a gush of bright red vaginal bleeding, moderate amount, no clots.  States this is 1st bleed.  Reports +FM.

## 2017-09-05 NOTE — H&P (Addendum)
Lauren Ingram is a 31 y.o. female @ 47 1/[redacted] weeks gestation presenting with complaint of painless vaginal bleeding since 11 am. Pt has been on pelvic rest. Known placenta previa with 1st episode of vaginal bleeding (+) FM. Some back pain  OB History    Gravida Para Term Preterm AB Living   6 3 3  0 2 3   SAB TAB Ectopic Multiple Live Births   2 0 0 0 1     Past Medical History:  Diagnosis Date  . IBS (irritable bowel syndrome)   . Medical history non-contributory   . Postpartum care following vaginal delivery (12/23) 10/23/2015   Past Surgical History:  Procedure Laterality Date  . NO PAST SURGERIES     Family History: family history includes Heart disease in her maternal grandfather and paternal grandfather. Social History:  reports that  has never smoked. she has never used smokeless tobacco. She reports that she does not drink alcohol or use drugs.     Maternal Diabetes: No Genetic Screening: Normal Maternal Ultrasounds/Referrals: Normal Fetal Ultrasounds or other Referrals:  None Maternal Substance Abuse:  No Significant Maternal Medications:  None Significant Maternal Lab Results:  Lab values include: Other:  Other Comments:  placenta previa  Review of Systems  All other systems reviewed and are negative.  Maternal Medical History:  Reason for admission: Vaginal bleeding.   Fetal activity: Perceived fetal activity is normal.   Last perceived fetal movement was within the past hour.    Prenatal complications: Bleeding.   Prenatal Complications - Diabetes: none.      Blood pressure 101/64, pulse 65, temperature 97.9 F (36.6 C), temperature source Oral, resp. rate 20, height 5\' 7"  (1.702 m), weight 61.2 kg (135 lb), SpO2 99 %, unknown if currently breastfeeding. Exam Physical Exam  Constitutional: She is oriented to person, place, and time. She appears well-developed and well-nourished. No distress.  HENT:  Head: Atraumatic.  Neck: Neck supple.   Cardiovascular: Regular rhythm.  Respiratory: Breath sounds normal.  GI: Soft.  Genitourinary:  Genitourinary Comments: Vulva: smeared with blood Cervix parous visually closed  (+) clots mucus Uterus gravid nontender  Musculoskeletal: She exhibits edema.  Neurological: She is alert and oriented to person, place, and time.  Skin: Skin is warm and dry.  Psychiatric: She has a normal mood and affect.   tracing: baseline 130 accel to 150 no ctx Prenatal labs: ABO, Rh:  A positive Antibody:  neg Rubella:  immune RPR:   NR HBsAg:   neg HIV:   neg GBS:   today  Assessment/Plan: 3rd trimester vaginal bleeding due to placenta previa Placenta Previa IUP @ 31 1/7 weeks P) admit routine labs. sono for EFW. BMZ. Magnesium for neuro prophylaxis. GBS culture. NICU consult  Continuous fetal monitoring Nuri Larmer A 09/05/2017, 1:48 PM

## 2017-09-05 NOTE — MAU Note (Signed)
Urine sent to lab 

## 2017-09-05 NOTE — MAU Note (Signed)
Pt reports she awakened with lower back pain this am, worsening over the morning. Reports she had a gush of blood, has history of previa.

## 2017-09-06 NOTE — Progress Notes (Signed)
HD #2 31 2/7 weeks Placenta previa C/o feeling drunk from the magnesium (+) FM Notes bleeding has decreased. " less gushing" Back pain resolved  O; BP (!) 90/51 (BP Location: Right Arm) Comment: notified nurse  Pulse 73   Temp (!) 97.5 F (36.4 C) (Oral)   Resp 17   Ht 5\' 7"  (1.702 m)   Wt 61.2 kg (135 lb)   SpO2 97%   BMI 21.14 kg/m  Lungs clear to A  Cor RRR Abdomen: gravid nontender Pelvic: pad: dark blood Extr: no edema or calf tenderness  Tracing: baseline 120 (+) accel to 140 no ctx  [place] Mfm Ob Comp + 14 Wk  Result Date: 09/05/2017 ----------------------------------------------------------------------  OBSTETRICS REPORT                      (Signed Final 09/05/2017 03:33 pm) ---------------------------------------------------------------------- Patient Info  ID #:       [id number]                          D.O.B.:  [birth year] (31 yrs)  Name:       Lauren Ingram                  Visit Date: 09/05/2017 02:58 pm ---------------------------------------------------------------------- Performed By  Performed By:     Jeanene Erb BS,      Ref. Address:     [address] [place]                                                             [place], [place]  Attending:        Griffin Dakin MD  Secondary Phy.:   3rd Nursing- HR                                                             OB                                                             3rd Floor  Referred By:      Alanda Slim             Location:         [facility]                    Otelia Hettinger MD ---------------------------------------------------------------------- Orders   #  Description                                 Code   1   [place] MFM OB COMP + 42 WK                      76805.01  ----------------------------------------------------------------------   #  Ordered By               Order #        Accession #    Episode #   1  Alanda Slim               [id number]      [id number]     [id number]      Marice Guidone  ---------------------------------------------------------------------- Indications   [redacted] weeks gestation of pregnancy                Z3A.31   Encounter for antenatal screening for          Z36.3   malformations   Placenta previa with hemorrhage, third         O44.13   trimester   Thrombocytopenia affecting pregnancy,          O99.119, D69.6   antepartum  ---------------------------------------------------------------------- OB History  Blood Type:            Height:  5'7"   Weight (lb):  135       BMI:  21.14  Gravidity:    6         Term:   3        Prem:   0        SAB:   2  TOP:          0       Ectopic:  0        Living: 3 ---------------------------------------------------------------------- Fetal Evaluation  Num Of Fetuses:     1  Fetal Heart         143  Rate(bpm):  Cardiac Activity:   Observed  Presentation:       Cephalic  Placenta:           Asymmetric Complete Previa  P. Cord Insertion:  Not well visualized  Amniotic Fluid  AFI FV:      Subjectively within normal  limits  AFI Sum(cm)     %Tile       Largest Pocket(cm)  15.87           57          7.04  RUQ(cm)       RLQ(cm)       LUQ(cm)        LLQ(cm)  7.04          1.6           4.26           2.97 ---------------------------------------------------------------------- Biometry  BPD:      77.6  mm     G. Age:  31w 1d         39  %    CI:        74.85   %    70 - 86                                                          FL/HC:      21.2   %    19.3 - 21.3  HC:      284.6  mm     G. Age:  31w 2d         18  %    HC/AC:      0.98        0.96 - 1.17  AC:      289.2  mm     G. Age:  32w 6d         90  %    FL/BPD:     77.8   %    71 - 87  FL:       60.4  mm     G. Age:  31w 3d          4  %    FL/AC:      20.9   %    20 - 24  Est. FW:    1912  gm      4 lb 3 oz     75  % ---------------------------------------------------------------------- Gestational Age  Clinical EDD:  31w 1d                                        EDD:   11/06/17  U/S Today:     31w 5d                                        EDD:   11/02/17  Best:          31w 1d     Det. By:  Clinical EDD             EDD:   11/06/17 ---------------------------------------------------------------------- Anatomy  Cranium:               Appears normal         Aortic Arch:            Not well visualized  Cavum:  Appears normal         Ductal Arch:            Not well visualized  Ventricles:            Appears normal         Diaphragm:              Appears normal  Choroid Plexus:        Appears normal         Stomach:                Appears normal, left                                                                        sided  Cerebellum:            Appears normal         Abdomen:                Appears normal  Posterior Fossa:       Appears normal         Abdominal Wall:         Not well visualized  Nuchal Fold:           Not applicable (>96    Cord Vessels:           Appears normal ([redacted]                         wks GA)                                        vessel cord)  Face:                  Appears normal         Kidneys:                Appear normal                         (orbits and profile)  Lips:                  Appears normal         Bladder:                Appears normal  Thoracic:              Appears normal         Spine:                  Limited views                                                                        appear normal  Heart:  Appears normal         Upper Extremities:      Not well visualized                         (4CH, axis, and situs  RVOT:                  Appears normal         Lower Extremities:      Not well visualized  LVOT:                  Appears normal  Other:  Parents do  not wish to know sex of fetus. Complete fetal anatomic          survey previously performed in office. Technically difficult due to          advanced GA and fetal position. ---------------------------------------------------------------------- Cervix Uterus Adnexa  Cervix  Not visualized (advanced GA >29wks) ---------------------------------------------------------------------- Impression  Singleton intrauterine pregnancy at 31+1 weeks with known  previa  Review of the anatomy shows no sonographic markers for  aneuploidy or structural anomalies  However, aortic arch and distal extremity evaluations should  be considered suboptimal secondary to late gestational age  and fetal position  Amniotic fluid volume is normal with an AFI of 15.9 cm  Estimated fetal weight is 1912g which is growth in the 75th  percentile ---------------------------------------------------------------------- Recommendations  Continue clinical evaluation and management ----------------------------------------------------------------------                 Griffin Dakin, MD Electronically Signed Final Report   09/05/2017 03:33 pm ---------------------------------------------------------------------- IMP: 3rd trimester vaginal bleeding Placenta previa IUP @  31 2/7 week P) cont inpt mgmt. Stop magnesium. NST q day. Complete BMZ today. Await NICU consult. GBS cx pending

## 2017-09-07 ENCOUNTER — Encounter (HOSPITAL_COMMUNITY): Payer: Self-pay | Admitting: *Deleted

## 2017-09-07 NOTE — Progress Notes (Signed)
Placenta Previa IUP @ 31 3/7 weeks S; good FM. Denies any abdominal pain  no vaginal bleeding  O; BP 104/62 (BP Location: Left Arm)   Pulse 77   Temp 98.1 F (36.7 C) (Oral)   Resp 16   Ht 5\' 7"  (1.702 m)   Wt 61.2 kg (135 lb)   SpO2 100%   BMI 21.14 kg/m  Lungs clear to A Cor RRR Abd gravid soft nontender Pad: no blood extr no edema or calf tenderness   Tracing: baseline 140 (+) accel to 155  IMP: Placenta previa with 3rd trim vaginal bleeding resolved IUP@ 31 3/7 weeks P) cont inpt. If remains w/o bleeding for 24 hrs then d/c home

## 2017-09-07 NOTE — Consult Note (Signed)
Neonatology Consult  Note:  At the request of the patients obstetrician Dr. Garwin Brothers I met with Lauren Ingram who is 31 2  weeks currently with pregnancy complicated by Placenta previa with bleeding.  Will stop magnesium today.  NST q day. s/p betamethasone 11/6-7.     We reviewed initial delivery room management, including CPAP, Shell, and low but certainly possible need for intubation for surfactant administration.  We discussed feeding immaturity and need for full po intake with multiple days of good weight gain and no apnea or bradycardia before discharge.  We reviewed increased risk of jaundice, infection, and temperature instability.   Discussed likely length of stay.  Thank you for allowing [place] to participate in her care.  Please call with questions. Higinio Roger, DO   Neonatologist   The total length of face-to-face or floor / unit time for this encounter was 25 minutes.  Counseling and / or coordination of care was greater than fifty percent of the time.

## 2017-09-08 LAB — CULTURE, BETA STREP (GROUP B ONLY)

## 2017-09-08 NOTE — Progress Notes (Signed)
Discharge teaching complete with pt. Pt understood all instructions and did not have any questions. Pt discharged home to family.  

## 2017-09-08 NOTE — Progress Notes (Signed)
HD # 4 31 4/7 weeks Placenta previa S/p BMZ  O; BP (!) 100/58 (BP Location: Left Arm)   Pulse (!) 54   Temp 98.2 F (36.8 C) (Oral)   Resp 18   Ht 5\' 7"  (1.702 m)   Wt 61.2 kg (135 lb)   SpO2 100%   BMI 21.14 kg/m  Lungs clear to P  abd gravid nontender Pad no blood Extr: no edema or calf tenderness  Tracing: baseline 140 (+) accel 155 No ctx  IMP: Placenta previa 3rd vaginal bleeding resolved IUP @ 31 4/7 weeks P0 d/c home pelvic rest. Keep OB appt Wednesday. PTL prec

## 2017-09-08 NOTE — Discharge Summary (Signed)
Physician Discharge Summary  Patient ID: Lauren Ingram MRN: [id number] DOB/AGE: June 14, 1986 31 y.o.  Admit date: 09/05/2017 Discharge date: 09/08/2017  Admission Diagnoses: 3rd trimester vaginal bleeding, placenta previa, IUP @ 31 1/7 weeks  Discharge Diagnoses: Placenta previa, IUP @ 31 4/7 weeks, 3rd trimester vaginal bleeding resolved  Active Problems:   Antepartum hemorrhage from placenta previa   Discharged Condition: stable  Hospital Course: pt was admitted to [facility] . She was started on Magnesium sulfate for neuro prophylaxis. Pt was given BMZ 11/6, 11/7. Sonogram done showed complete placenta previa, efw 4lb 3 oz( 75%) Pt was placed on continuous monitoring until bleeding subsided. Pt was then observed for 24 hrs post cessation of bleeding. NST reactive q day. GBS cx done( neg). NICU consult obtained  Consults: neonatology  Significant Diagnostic Studies: labs:  CBC    Component Value Date/Time   WBC 6.8 09/05/2017 1325   RBC 3.34 (L) 09/05/2017 1325   HGB 10.2 (L) 09/05/2017 1325   HCT 30.6 (L) 09/05/2017 1325   PLT 140 (L) 09/05/2017 1325   MCV 91.6 09/05/2017 1325   MCH 30.5 09/05/2017 1325   MCHC 33.3 09/05/2017 1325   RDW 13.6 09/05/2017 1325    [place] Mfm Ob Comp + 14 Wk  Result Date: 09/05/2017 ----------------------------------------------------------------------  OBSTETRICS REPORT                      (Signed Final 09/05/2017 03:33 pm) ---------------------------------------------------------------------- Patient Info  ID #:       [id number]                          D.O.B.:  May 27, 1986 (31 yrs)  Name:       Lauren Ingram                  Visit Date: 09/05/2017 02:58 pm ---------------------------------------------------------------------- Performed By  Performed By:     Jeanene Erb BS,      Ref. Address:     [place]  [place], [place]  Attending:        Griffin Dakin MD         Secondary Phy.:   3rd Nursing- HR                                                             OB                                                             3rd Floor  Referred By:      Alanda Slim             Location:         [facility]                    Claiborne Stroble MD ---------------------------------------------------------------------- Orders   #  Description                                 Code   1  [place] MFM OB COMP + 57 WK                      76805.01  ----------------------------------------------------------------------   #  Ordered By               Order #        Accession #    Episode #   1  Alanda Slim               [id number]      [id number]     [id number]      Joslynne Klatt  ---------------------------------------------------------------------- Indications   [redacted] weeks gestation of pregnancy                Z3A.31   Encounter for antenatal screening for          Z36.3   malformations   Placenta previa with hemorrhage, third         O44.13   trimester   Thrombocytopenia affecting pregnancy,          O99.119, D69.6   antepartum  ---------------------------------------------------------------------- OB History  Blood Type:            Height:  5'7"   Weight (lb):  135       BMI:  21.14  Gravidity:    6         Term:   3        Prem:   0        SAB:   2  TOP:          0       Ectopic:  0  Living: 3 ---------------------------------------------------------------------- Fetal Evaluation  Num Of Fetuses:     1  Fetal Heart         143  Rate(bpm):  Cardiac Activity:   Observed  Presentation:       Cephalic  Placenta:           Asymmetric Complete Previa  P. Cord Insertion:  Not well visualized  Amniotic Fluid  AFI FV:      Subjectively within  normal limits  AFI Sum(cm)     %Tile       Largest Pocket(cm)  15.87           57          7.04  RUQ(cm)       RLQ(cm)       LUQ(cm)        LLQ(cm)  7.04          1.6           4.26           2.97 ---------------------------------------------------------------------- Biometry  BPD:      77.6  mm     G. Age:  31w 1d         39  %    CI:        74.85   %    70 - 86                                                          FL/HC:      21.2   %    19.3 - 21.3  HC:      284.6  mm     G. Age:  31w 2d         18  %    HC/AC:      0.98        0.96 - 1.17  AC:      289.2  mm     G. Age:  32w 6d         90  %    FL/BPD:     77.8   %    71 - 87  FL:       60.4  mm     G. Age:  31w 3d         66  %    FL/AC:      20.9   %    20 - 24  Est. FW:    1912  gm      4 lb 3 oz     75  % ---------------------------------------------------------------------- Gestational Age  Clinical EDD:  31w 1d                                        EDD:   11/06/17  U/S Today:     31w 5d                                        EDD:   11/02/17  Best:          31w 1d     Det.  By:  Clinical EDD             EDD:   11/06/17 ---------------------------------------------------------------------- Anatomy  Cranium:               Appears normal         Aortic Arch:            Not well visualized  Cavum:                 Appears normal         Ductal Arch:            Not well visualized  Ventricles:            Appears normal         Diaphragm:              Appears normal  Choroid Plexus:        Appears normal         Stomach:                Appears normal, left                                                                        sided  Cerebellum:            Appears normal         Abdomen:                Appears normal  Posterior Fossa:       Appears normal         Abdominal Wall:         Not well visualized  Nuchal Fold:           Not applicable (>70    Cord Vessels:           Appears normal ([redacted]                         wks GA)                                         vessel cord)  Face:                  Appears normal         Kidneys:                Appear normal                         (orbits and profile)  Lips:                  Appears normal         Bladder:                Appears normal  Thoracic:              Appears normal         Spine:                  Limited views  appear normal  Heart:                 Appears normal         Upper Extremities:      Not well visualized                         (4CH, axis, and situs  RVOT:                  Appears normal         Lower Extremities:      Not well visualized  LVOT:                  Appears normal  Other:  Parents do not wish to know sex of fetus. Complete fetal anatomic          survey previously performed in office. Technically difficult due to          advanced GA and fetal position. ---------------------------------------------------------------------- Cervix Uterus Adnexa  Cervix  Not visualized (advanced GA >29wks) ---------------------------------------------------------------------- Impression  Singleton intrauterine pregnancy at 31+1 weeks with known  previa  Review of the anatomy shows no sonographic markers for  aneuploidy or structural anomalies  However, aortic arch and distal extremity evaluations should  be considered suboptimal secondary to late gestational age  and fetal position  Amniotic fluid volume is normal with an AFI of 15.9 cm  Estimated fetal weight is 1912g which is growth in the 75th  percentile ---------------------------------------------------------------------- Recommendations  Continue clinical evaluation and management ----------------------------------------------------------------------                 Griffin Dakin, MD Electronically Signed Final Report   09/05/2017 03:33 pm ----------------------------------------------------------------------   Treatments: steroids: BMZ, Magnesium sulfate  Discharge Exam: Blood  pressure (!) 100/58, pulse (!) 54, temperature 98.2 F (36.8 C), temperature source Oral, resp. rate 18, height 5\' 7"  (1.702 m), weight 61.2 kg (135 lb), SpO2 100 %, unknown if currently breastfeeding. General appearance: alert, cooperative and no distress Resp: normal percussion bilaterally GI: gravid soft nontender Pelvic: deferred Extremities: no edema, redness or tenderness in the calves or thighs  Disposition: 01-Home or Self Care  Discharge Instructions    Diet general   Complete by:  As directed    Discharge instructions   Complete by:  As directed    Nothing per vagina. Call if vaginal spotting or bleeding, decreased fetal movement   May walk up steps   Complete by:  As directed      Allergies as of 09/08/2017   No Known Allergies     Medication List    TAKE these medications   prenatal multivitamin Tabs tablet Take 1 tablet daily at 12 noon by mouth.      Follow-up Information    Servando Salina, MD Follow up on 09/13/2017.   Specialty:  Obstetrics and Gynecology Contact information: [address] [place] [address] [phone]           Signed: Servando Salina A 09/08/2017, 2:47 PM

## 2017-09-12 DIAGNOSIS — M545 Low back pain: Secondary | ICD-10-CM | POA: Diagnosis not present

## 2017-09-12 DIAGNOSIS — O4413 Placenta previa with hemorrhage, third trimester: Secondary | ICD-10-CM | POA: Diagnosis not present

## 2017-09-12 DIAGNOSIS — Z3A32 32 weeks gestation of pregnancy: Secondary | ICD-10-CM | POA: Diagnosis not present

## 2017-09-12 DIAGNOSIS — O9989 Other specified diseases and conditions complicating pregnancy, childbirth and the puerperium: Secondary | ICD-10-CM | POA: Diagnosis not present

## 2017-09-12 DIAGNOSIS — Z23 Encounter for immunization: Secondary | ICD-10-CM | POA: Diagnosis not present

## 2017-09-18 DIAGNOSIS — O4413 Placenta previa with hemorrhage, third trimester: Secondary | ICD-10-CM | POA: Diagnosis not present

## 2017-09-18 DIAGNOSIS — Z3A33 33 weeks gestation of pregnancy: Secondary | ICD-10-CM | POA: Diagnosis not present

## 2017-09-26 DIAGNOSIS — O26893 Other specified pregnancy related conditions, third trimester: Secondary | ICD-10-CM | POA: Diagnosis not present

## 2017-09-26 DIAGNOSIS — Z3A34 34 weeks gestation of pregnancy: Secondary | ICD-10-CM | POA: Diagnosis not present

## 2017-09-26 DIAGNOSIS — O4413 Placenta previa with hemorrhage, third trimester: Secondary | ICD-10-CM | POA: Diagnosis not present

## 2017-09-28 ENCOUNTER — Other Ambulatory Visit: Payer: Self-pay | Admitting: Obstetrics and Gynecology

## 2017-09-29 ENCOUNTER — Encounter (HOSPITAL_COMMUNITY): Payer: Self-pay

## 2017-10-02 ENCOUNTER — Telehealth (HOSPITAL_COMMUNITY): Payer: Self-pay | Admitting: *Deleted

## 2017-10-02 DIAGNOSIS — Z3A35 35 weeks gestation of pregnancy: Secondary | ICD-10-CM | POA: Diagnosis not present

## 2017-10-02 DIAGNOSIS — Z3685 Encounter for antenatal screening for Streptococcus B: Secondary | ICD-10-CM | POA: Diagnosis not present

## 2017-10-02 DIAGNOSIS — O4413 Placenta previa with hemorrhage, third trimester: Secondary | ICD-10-CM | POA: Diagnosis not present

## 2017-10-02 DIAGNOSIS — Z01818 Encounter for other preprocedural examination: Secondary | ICD-10-CM | POA: Diagnosis not present

## 2017-10-02 NOTE — Telephone Encounter (Signed)
Preadmission screen  

## 2017-10-03 ENCOUNTER — Encounter (HOSPITAL_COMMUNITY): Payer: Self-pay

## 2017-10-10 ENCOUNTER — Encounter (HOSPITAL_COMMUNITY)
Admission: RE | Admit: 2017-10-10 | Discharge: 2017-10-10 | Disposition: A | Discharge status: Home / Self Care | Payer: BLUE CROSS/BLUE SHIELD | Source: Ambulatory Visit | Attending: Obstetrics and Gynecology | Admitting: Obstetrics and Gynecology

## 2017-10-10 DIAGNOSIS — O9081 Anemia of the puerperium: Secondary | ICD-10-CM | POA: Diagnosis not present

## 2017-10-10 DIAGNOSIS — Z3A36 36 weeks gestation of pregnancy: Secondary | ICD-10-CM | POA: Diagnosis not present

## 2017-10-10 DIAGNOSIS — O4403 Placenta previa specified as without hemorrhage, third trimester: Secondary | ICD-10-CM | POA: Diagnosis not present

## 2017-10-10 DIAGNOSIS — D62 Acute posthemorrhagic anemia: Secondary | ICD-10-CM | POA: Diagnosis not present

## 2017-10-10 DIAGNOSIS — O4413 Placenta previa with hemorrhage, third trimester: Secondary | ICD-10-CM | POA: Diagnosis not present

## 2017-10-10 LAB — CBC
HCT: 31.4 % — ABNORMAL LOW (ref 36.0–46.0)
HEMOGLOBIN: 9.8 g/dL — AB (ref 12.0–15.0)
MCH: 28.3 pg (ref 26.0–34.0)
MCHC: 31.2 g/dL (ref 30.0–36.0)
MCV: 90.8 fL (ref 78.0–100.0)
PLATELETS: 177 10*3/uL (ref 150–400)
RBC: 3.46 MIL/uL — ABNORMAL LOW (ref 3.87–5.11)
RDW: 13.7 % (ref 11.5–15.5)
WBC: 6.3 10*3/uL (ref 4.0–10.5)

## 2017-10-10 NOTE — Patient Instructions (Signed)
Lauren Ingram  10/10/2017   Your procedure is scheduled on:  10/11/2017   Enter through the Main Entrance of [facility] at [place] up the phone at the desk and dial [phone]  Call this number if you have problems the morning of surgery:[phone]  Remember:   Do not eat food:After Midnight.  Do not drink clear liquids: After Midnight.  Take these medicines the morning of surgery with A SIP OF WATER: none   Do not wear jewelry, make-up or nail polish.  Do not wear lotions, powders, or perfumes. Do not wear deodorant.  Do not shave 48 hours prior to surgery.  Do not bring valuables to the hospital.  [facility] is not   responsible for any belongings or valuables brought to the hospital.  Contacts, dentures or bridgework may not be worn into surgery.  Leave suitcase in the car. After surgery it may be brought to your room.  For patients admitted to the hospital, checkout time is 11:00 AM the day of              discharge.    N/A   Please read over the following fact sheets that you were given:   Surgical Site Infection Prevention

## 2017-10-11 ENCOUNTER — Inpatient Hospital Stay (HOSPITAL_COMMUNITY): Payer: BLUE CROSS/BLUE SHIELD | Admitting: Certified Registered Nurse Anesthetist

## 2017-10-11 ENCOUNTER — Encounter (HOSPITAL_COMMUNITY)
Admission: RE | Admit: 2017-10-11 | Discharge: 2017-10-11 | Disposition: A | Discharge status: Home / Self Care | Payer: BLUE CROSS/BLUE SHIELD | Source: Ambulatory Visit

## 2017-10-11 ENCOUNTER — Inpatient Hospital Stay (HOSPITAL_COMMUNITY)
Admission: RE | Admit: 2017-10-11 | Discharge: 2017-10-13 | DRG: 787 | Disposition: A | Discharge status: Home / Self Care | Payer: BLUE CROSS/BLUE SHIELD | Source: Ambulatory Visit | Attending: Obstetrics and Gynecology | Admitting: Obstetrics and Gynecology

## 2017-10-11 ENCOUNTER — Encounter (HOSPITAL_COMMUNITY)
Admission: RE | Disposition: A | Discharge status: Home / Self Care | Payer: Self-pay | Source: Ambulatory Visit | Attending: Obstetrics and Gynecology

## 2017-10-11 ENCOUNTER — Encounter (HOSPITAL_COMMUNITY): Payer: Self-pay | Admitting: Certified Registered Nurse Anesthetist

## 2017-10-11 DIAGNOSIS — O4413 Placenta previa with hemorrhage, third trimester: Secondary | ICD-10-CM | POA: Diagnosis not present

## 2017-10-11 DIAGNOSIS — Z3A36 36 weeks gestation of pregnancy: Secondary | ICD-10-CM

## 2017-10-11 DIAGNOSIS — O9081 Anemia of the puerperium: Secondary | ICD-10-CM | POA: Diagnosis not present

## 2017-10-11 DIAGNOSIS — D62 Acute posthemorrhagic anemia: Secondary | ICD-10-CM | POA: Diagnosis not present

## 2017-10-11 DIAGNOSIS — O4403 Placenta previa specified as without hemorrhage, third trimester: Principal | ICD-10-CM | POA: Diagnosis present

## 2017-10-11 DIAGNOSIS — O4400 Placenta previa specified as without hemorrhage, unspecified trimester: Secondary | ICD-10-CM | POA: Diagnosis present

## 2017-10-11 HISTORY — DX: Acute posthemorrhagic anemia: D62

## 2017-10-11 LAB — RPR: RPR Ser Ql: NONREACTIVE

## 2017-10-11 SURGERY — Surgical Case
Anesthesia: Spinal

## 2017-10-11 MED ORDER — MEPERIDINE HCL 25 MG/ML IJ SOLN: 6.2500 mg | INTRAMUSCULAR | Status: DC | PRN

## 2017-10-11 MED ORDER — BUPIVACAINE HCL (PF) 0.25 % IJ SOLN
INTRAMUSCULAR | Status: AC
  Filled 2017-10-11: qty 10

## 2017-10-11 MED ORDER — METHYLERGONOVINE MALEATE 0.2 MG/ML IJ SOLN
INTRAMUSCULAR | Status: AC
  Filled 2017-10-11: qty 1

## 2017-10-11 MED ORDER — DEXAMETHASONE SODIUM PHOSPHATE 4 MG/ML IJ SOLN
INTRAMUSCULAR | Status: AC
Start: 2017-10-11 — End: 2017-10-11
  Filled 2017-10-11: qty 1

## 2017-10-11 MED ORDER — BUPIVACAINE IN DEXTROSE 0.75-8.25 % IT SOLN
INTRATHECAL | Status: AC
  Filled 2017-10-11: qty 2

## 2017-10-11 MED ORDER — IBUPROFEN 600 MG PO TABS
600.0000 mg | ORAL_TABLET | Freq: Four times a day (QID) | ORAL | Status: DC
  Administered 2017-10-11 – 2017-10-13 (×9): 600 mg via ORAL
  Filled 2017-10-11 (×9): qty 1

## 2017-10-11 MED ORDER — METHYLERGONOVINE MALEATE 0.2 MG/ML IJ SOLN
INTRAMUSCULAR | Status: DC | PRN
  Administered 2017-10-11: 0.2 mg via INTRAMUSCULAR

## 2017-10-11 MED ORDER — LACTATED RINGERS IV SOLN
INTRAVENOUS | Status: DC
  Administered 2017-10-11: 14:00:00 via INTRAVENOUS

## 2017-10-11 MED ORDER — OXYTOCIN 40 UNITS IN LACTATED RINGERS INFUSION - SIMPLE MED: 2.5000 [IU]/h | INTRAVENOUS | Status: DC

## 2017-10-11 MED ORDER — PHENYLEPHRINE 8 MG IN D5W 100 ML (0.08MG/ML) PREMIX OPTIME
INJECTION | INTRAVENOUS | Status: AC
  Filled 2017-10-11: qty 100

## 2017-10-11 MED ORDER — SIMETHICONE 80 MG PO CHEW
80.0000 mg | CHEWABLE_TABLET | ORAL | Status: DC
  Administered 2017-10-12 (×2): 80 mg via ORAL
  Filled 2017-10-11 (×2): qty 1

## 2017-10-11 MED ORDER — WITCH HAZEL-GLYCERIN EX PADS: 1.0000 "application " | MEDICATED_PAD | CUTANEOUS | Status: DC | PRN

## 2017-10-11 MED ORDER — PROMETHAZINE HCL 25 MG/ML IJ SOLN: 6.2500 mg | INTRAMUSCULAR | Status: DC | PRN

## 2017-10-11 MED ORDER — DIPHENHYDRAMINE HCL 25 MG PO CAPS: 25.0000 mg | ORAL_CAPSULE | Freq: Four times a day (QID) | ORAL | Status: DC | PRN

## 2017-10-11 MED ORDER — DEXAMETHASONE SODIUM PHOSPHATE 4 MG/ML IJ SOLN
INTRAMUSCULAR | Status: DC | PRN
  Administered 2017-10-11: 4 mg via INTRAVENOUS

## 2017-10-11 MED ORDER — ONDANSETRON HCL 4 MG/2ML IJ SOLN
INTRAMUSCULAR | Status: DC | PRN
  Administered 2017-10-11: 4 mg via INTRAVENOUS

## 2017-10-11 MED ORDER — BUPIVACAINE HCL (PF) 0.25 % IJ SOLN
INTRAMUSCULAR | Status: DC | PRN
  Administered 2017-10-11: 10 mL

## 2017-10-11 MED ORDER — TETANUS-DIPHTH-ACELL PERTUSSIS 5-2.5-18.5 LF-MCG/0.5 IM SUSP: 0.5000 mL | Freq: Once | INTRAMUSCULAR | Status: DC

## 2017-10-11 MED ORDER — BUPIVACAINE HCL (PF) 0.5 % IJ SOLN
INTRAMUSCULAR | Status: AC
Start: 2017-10-11 — End: 2017-10-11
  Filled 2017-10-11: qty 30

## 2017-10-11 MED ORDER — PRENATAL MULTIVITAMIN CH
1.0000 | ORAL_TABLET | Freq: Every day | ORAL | Status: DC
  Administered 2017-10-11 – 2017-10-13 (×3): 1 via ORAL
  Filled 2017-10-11 (×4): qty 1

## 2017-10-11 MED ORDER — SCOPOLAMINE 1 MG/3DAYS TD PT72
MEDICATED_PATCH | TRANSDERMAL | Status: AC
  Filled 2017-10-11: qty 1

## 2017-10-11 MED ORDER — PHENYLEPHRINE 8 MG IN D5W 100 ML (0.08MG/ML) PREMIX OPTIME
INJECTION | INTRAVENOUS | Status: DC | PRN
  Administered 2017-10-11: 60 ug/min via INTRAVENOUS

## 2017-10-11 MED ORDER — FENTANYL CITRATE (PF) 100 MCG/2ML IJ SOLN
INTRAMUSCULAR | Status: AC
  Filled 2017-10-11: qty 2

## 2017-10-11 MED ORDER — BUPIVACAINE IN DEXTROSE 0.75-8.25 % IT SOLN
INTRATHECAL | Status: DC | PRN
  Administered 2017-10-11: 1.6 mL via INTRATHECAL

## 2017-10-11 MED ORDER — CEFAZOLIN SODIUM-DEXTROSE 2-3 GM-%(50ML) IV SOLR
INTRAVENOUS | Status: DC | PRN
  Administered 2017-10-11: 2 g via INTRAVENOUS

## 2017-10-11 MED ORDER — OXYTOCIN 10 UNIT/ML IJ SOLN
INTRAMUSCULAR | Status: AC
  Filled 2017-10-11: qty 1

## 2017-10-11 MED ORDER — FENTANYL CITRATE (PF) 100 MCG/2ML IJ SOLN
INTRAMUSCULAR | Status: DC | PRN
Start: 2017-10-11 — End: 2017-10-11
  Administered 2017-10-11: 10 ug via INTRATHECAL

## 2017-10-11 MED ORDER — SOD CITRATE-CITRIC ACID 500-334 MG/5ML PO SOLN
ORAL | Status: AC
  Filled 2017-10-11: qty 15

## 2017-10-11 MED ORDER — MIDAZOLAM HCL 2 MG/2ML IJ SOLN: 0.5000 mg | Freq: Once | INTRAMUSCULAR | Status: DC | PRN

## 2017-10-11 MED ORDER — OXYTOCIN 10 UNIT/ML IJ SOLN
INTRAVENOUS | Status: DC | PRN
  Administered 2017-10-11: 40 [IU] via INTRAVENOUS

## 2017-10-11 MED ORDER — COCONUT OIL OIL: 1.0000 "application " | TOPICAL_OIL | Status: DC | PRN

## 2017-10-11 MED ORDER — ONDANSETRON HCL 4 MG/2ML IJ SOLN
INTRAMUSCULAR | Status: AC
  Filled 2017-10-11: qty 2

## 2017-10-11 MED ORDER — SOD CITRATE-CITRIC ACID 500-334 MG/5ML PO SOLN
30.0000 mL | Freq: Once | ORAL | Status: AC
  Administered 2017-10-11: 30 mL via ORAL

## 2017-10-11 MED ORDER — OXYCODONE-ACETAMINOPHEN 5-325 MG PO TABS: 1.0000 | ORAL_TABLET | ORAL | Status: DC | PRN

## 2017-10-11 MED ORDER — MORPHINE SULFATE (PF) 4 MG/ML IV SOLN: 1.0000 mg | INTRAVENOUS | Status: DC | PRN

## 2017-10-11 MED ORDER — SIMETHICONE 80 MG PO CHEW: 80.0000 mg | CHEWABLE_TABLET | ORAL | Status: DC | PRN

## 2017-10-11 MED ORDER — SENNOSIDES-DOCUSATE SODIUM 8.6-50 MG PO TABS
2.0000 | ORAL_TABLET | ORAL | Status: DC
  Administered 2017-10-12 (×2): 2 via ORAL
  Filled 2017-10-11 (×2): qty 2

## 2017-10-11 MED ORDER — CEFAZOLIN SODIUM-DEXTROSE 2-4 GM/100ML-% IV SOLN
2.0000 g | INTRAVENOUS | Status: DC
  Filled 2017-10-11: qty 100

## 2017-10-11 MED ORDER — DIBUCAINE 1 % RE OINT: 1.0000 "application " | TOPICAL_OINTMENT | RECTAL | Status: DC | PRN

## 2017-10-11 MED ORDER — SODIUM CHLORIDE 0.9 % IR SOLN
Status: DC | PRN
  Administered 2017-10-11: 800 mL

## 2017-10-11 MED ORDER — LACTATED RINGERS IV SOLN
INTRAVENOUS | Status: DC
  Administered 2017-10-11 (×2): via INTRAVENOUS

## 2017-10-11 MED ORDER — ZOLPIDEM TARTRATE 5 MG PO TABS: 5.0000 mg | ORAL_TABLET | Freq: Every evening | ORAL | Status: DC | PRN

## 2017-10-11 MED ORDER — SCOPOLAMINE 1 MG/3DAYS TD PT72
1.0000 | MEDICATED_PATCH | Freq: Once | TRANSDERMAL | Status: DC
  Administered 2017-10-11: 1.5 mg via TRANSDERMAL

## 2017-10-11 MED ORDER — OXYCODONE-ACETAMINOPHEN 5-325 MG PO TABS: 2.0000 | ORAL_TABLET | ORAL | Status: DC | PRN

## 2017-10-11 MED ORDER — MORPHINE SULFATE (PF) 0.5 MG/ML IJ SOLN
INTRAMUSCULAR | Status: DC | PRN
  Administered 2017-10-11: .2 mg via INTRATHECAL

## 2017-10-11 MED ORDER — SIMETHICONE 80 MG PO CHEW
80.0000 mg | CHEWABLE_TABLET | Freq: Three times a day (TID) | ORAL | Status: DC
  Administered 2017-10-11 – 2017-10-12 (×4): 80 mg via ORAL
  Filled 2017-10-11 (×4): qty 1

## 2017-10-11 MED ORDER — BUPIVACAINE IN DEXTROSE 0.75-8.25 % IT SOLN: INTRATHECAL | Status: DC | PRN

## 2017-10-11 MED ORDER — EPHEDRINE SULFATE 50 MG/ML IJ SOLN
INTRAMUSCULAR | Status: DC | PRN
  Administered 2017-10-11: 10 mg via INTRAVENOUS

## 2017-10-11 MED ORDER — LACTATED RINGERS IV SOLN
INTRAVENOUS | Status: DC | PRN
  Administered 2017-10-11: 09:00:00 via INTRAVENOUS

## 2017-10-11 MED ORDER — OXYTOCIN 10 UNIT/ML IJ SOLN
INTRAMUSCULAR | Status: AC
  Filled 2017-10-11: qty 3

## 2017-10-11 MED ORDER — MORPHINE SULFATE (PF) 0.5 MG/ML IJ SOLN
INTRAMUSCULAR | Status: AC
  Filled 2017-10-11: qty 10

## 2017-10-11 MED ORDER — MENTHOL 3 MG MT LOZG: 1.0000 | LOZENGE | OROMUCOSAL | Status: DC | PRN

## 2017-10-11 MED ORDER — KETOROLAC TROMETHAMINE 30 MG/ML IJ SOLN: 30.0000 mg | Freq: Once | INTRAMUSCULAR | Status: DC

## 2017-10-11 SURGICAL SUPPLY — 48 items
BARRIER ADHS 3X4 INTERCEED (GAUZE/BANDAGES/DRESSINGS) ×3 IMPLANT
BENZOIN TINCTURE PRP APPL 2/3 (GAUZE/BANDAGES/DRESSINGS) ×3 IMPLANT
CHLORAPREP W/TINT 26ML (MISCELLANEOUS) ×3 IMPLANT
CLAMP CORD UMBIL (MISCELLANEOUS) IMPLANT
CLOSURE STERI-STRIP 1/2X4 (GAUZE/BANDAGES/DRESSINGS) ×1
CLOSURE WOUND 1/2 X4 (GAUZE/BANDAGES/DRESSINGS)
CLOTH BEACON ORANGE TIMEOUT ST (SAFETY) ×3 IMPLANT
CLSR STERI-STRIP ANTIMIC 1/2X4 (GAUZE/BANDAGES/DRESSINGS) ×2 IMPLANT
CONTAINER PREFILL 10% NBF 15ML (MISCELLANEOUS) IMPLANT
DRAPE C SECTION CLR SCREEN (DRAPES) ×3 IMPLANT
DRSG OPSITE POSTOP 4X10 (GAUZE/BANDAGES/DRESSINGS) ×3 IMPLANT
ELECT REM PT RETURN 9FT ADLT (ELECTROSURGICAL) ×3
ELECTRODE REM PT RTRN 9FT ADLT (ELECTROSURGICAL) ×1 IMPLANT
EXTRACTOR VACUUM M CUP 4 TUBE (SUCTIONS) IMPLANT
EXTRACTOR VACUUM M CUP 4' TUBE (SUCTIONS)
GLOVE BIOGEL PI IND STRL 7.0 (GLOVE) ×2 IMPLANT
GLOVE BIOGEL PI INDICATOR 7.0 (GLOVE) ×4
GLOVE ECLIPSE 6.5 STRL STRAW (GLOVE) ×3 IMPLANT
GOWN STRL REUS W/TWL LRG LVL3 (GOWN DISPOSABLE) ×6 IMPLANT
HEMOSTAT SURGICEL 2X14 (HEMOSTASIS) ×3 IMPLANT
KIT ABG SYR 3ML LUER SLIP (SYRINGE) IMPLANT
NEEDLE HYPO 22GX1.5 SAFETY (NEEDLE) ×3 IMPLANT
NEEDLE HYPO 25X5/8 SAFETYGLIDE (NEEDLE) IMPLANT
NS IRRIG 1000ML POUR BTL (IV SOLUTION) ×3 IMPLANT
PACK C SECTION WH (CUSTOM PROCEDURE TRAY) ×3 IMPLANT
PAD OB MATERNITY 4.3X12.25 (PERSONAL CARE ITEMS) ×3 IMPLANT
RTRCTR C-SECT PINK 25CM LRG (MISCELLANEOUS) IMPLANT
SPONGE LAP 18X18 X RAY DECT (DISPOSABLE) ×12 IMPLANT
STRIP CLOSURE SKIN 1/2X4 (GAUZE/BANDAGES/DRESSINGS) IMPLANT
SUT CHROMIC GUT AB #0 18 (SUTURE) IMPLANT
SUT MNCRL 0 VIOLET CTX 36 (SUTURE) ×3 IMPLANT
SUT MON AB 2-0 SH 27 (SUTURE)
SUT MON AB 2-0 SH27 (SUTURE) IMPLANT
SUT MON AB 3-0 SH 27 (SUTURE)
SUT MON AB 3-0 SH27 (SUTURE) IMPLANT
SUT MON AB 4-0 PS1 27 (SUTURE) IMPLANT
SUT MONOCRYL 0 CTX 36 (SUTURE) ×6
SUT PLAIN 2 0 (SUTURE)
SUT PLAIN 2 0 XLH (SUTURE) IMPLANT
SUT PLAIN ABS 2-0 CT1 27XMFL (SUTURE) IMPLANT
SUT VIC AB 0 CT1 36 (SUTURE) ×6 IMPLANT
SUT VIC AB 2-0 CT1 27 (SUTURE) ×2
SUT VIC AB 2-0 CT1 TAPERPNT 27 (SUTURE) ×1 IMPLANT
SUT VIC AB 4-0 KS 27 (SUTURE) ×3 IMPLANT
SUT VIC AB 4-0 PS2 27 (SUTURE) IMPLANT
SYR CONTROL 10ML LL (SYRINGE) ×3 IMPLANT
TOWEL OR 17X24 6PK STRL BLUE (TOWEL DISPOSABLE) ×3 IMPLANT
TRAY FOLEY BAG SILVER LF 14FR (SET/KITS/TRAYS/PACK) IMPLANT

## 2017-10-11 NOTE — Addendum Note (Signed)
Addendum  created 10/11/17 1713 by Alvy Bimler, CRNA   Sign clinical note

## 2017-10-11 NOTE — Anesthesia Preprocedure Evaluation (Addendum)
Anesthesia Evaluation  Patient identified by MRN, date of birth, ID band Patient awake    Reviewed: Allergy & Precautions, NPO status , Patient's Chart, lab work & pertinent test results  History of Anesthesia Complications Negative for: history of anesthetic complications  Airway Mallampati: IV  TM Distance: >3 FB Neck ROM: Full    Dental  (+) Dental Advisory Given   Pulmonary neg pulmonary ROS,    breath sounds clear to auscultation       Cardiovascular negative cardio ROS   Rhythm:Regular Rate:Normal     Neuro/Psych negative neurological ROS     GI/Hepatic negative GI ROS, Neg liver ROS,   Endo/Other  negative endocrine ROS  Renal/GU negative Renal ROS     Musculoskeletal   Abdominal   Peds  Hematology  (+) anemia , plt 177k, Hb 9.8   Anesthesia Other Findings   Reproductive/Obstetrics (+) Pregnancy (placenta previa)                            Anesthesia Physical Anesthesia Plan  ASA: II  Anesthesia Plan: Spinal   Post-op Pain Management:    Induction:   PONV Risk Score and Plan: 2 and Ondansetron, Dexamethasone and Scopolamine patch - Pre-op  Airway Management Planned:   Additional Equipment:   Intra-op Plan:   Post-operative Plan:   Informed Consent: I have reviewed the patients History and Physical, chart, labs and discussed the procedure including the risks, benefits and alternatives for the proposed anesthesia with the patient or authorized representative who has indicated his/her understanding and acceptance.   Dental advisory given  Plan Discussed with: CRNA and Surgeon  Anesthesia Plan Comments: (Plan routine monitors, SAB)        Anesthesia Quick Evaluation

## 2017-10-11 NOTE — Brief Op Note (Signed)
10/11/2017  10:33 AM  PATIENT:  Lauren Ingram  32 y.o. female  PRE-OPERATIVE DIAGNOSIS:  Placenta Previa, IUP @ 36 2/7 weeks  POST-OPERATIVE DIAGNOSIS:  same  PROCEDURE:  Primary Cesarean section, kerr hysterotomy  SURGEON:  Surgeon(s) and Role:    * Servando Salina, MD - Primary  PHYSICIAN ASSISTANT:   ASSISTANTS: Derrell Lolling, CNM   ANESTHESIA:   spinal FINDINGS; Live female infant, cord around right hand, nl tubes and ovaries, post placenta previa EBL:  1268 mL   BLOOD ADMINISTERED:none  DRAINS: none   LOCAL MEDICATIONS USED:  MARCAINE     SPECIMEN:  Source of Specimen:  placenta  DISPOSITION OF SPECIMEN:  N/A  COUNTS:  YES  TOURNIQUET:  * No tourniquets in log *  DICTATION: .Other Dictation: Dictation Number [id number]  PLAN OF CARE: Admit to inpatient   PATIENT DISPOSITION:  PACU - hemodynamically stable.   Delay start of Pharmacological VTE agent (>24hrs) due to surgical blood loss or risk of bleeding: no

## 2017-10-11 NOTE — Anesthesia Postprocedure Evaluation (Signed)
Anesthesia Post Note  Patient: MICHALENE DEBRULER  Procedure(s) Performed: Primary CESAREAN SECTION (N/A )     Patient location during evaluation: PACU Anesthesia Type: Spinal Level of consciousness: awake and alert, patient cooperative and oriented Pain management: pain level controlled Vital Signs Assessment: post-procedure vital signs reviewed and stable Respiratory status: spontaneous breathing, respiratory function stable and nonlabored ventilation Cardiovascular status: blood pressure returned to baseline and stable Postop Assessment: no apparent nausea or vomiting, spinal receding and patient able to bend at knees Anesthetic complications: no    Last Vitals:  Vitals:   10/11/17 1115 10/11/17 1116  BP:  103/76  Pulse: (!) 58 69  Resp: 16 16  Temp:    SpO2: 100% 100%    Last Pain:  Vitals:   10/11/17 1100  TempSrc: Oral  PainSc: 2    Pain Goal:                 Kamir Selover,E. Neida Ellegood

## 2017-10-11 NOTE — Transfer of Care (Signed)
Immediate Anesthesia Transfer of Care Note  Patient: Lauren Ingram  Procedure(s) Performed: Primary CESAREAN SECTION (N/A )  Patient Location: PACU  Anesthesia Type:Spinal  Level of Consciousness: awake, alert  and oriented  Airway & Oxygen Therapy: Patient Spontanous Breathing  Post-op Assessment: Report given to RN and Post -op Vital signs reviewed and stable  Post vital signs: Reviewed and stable  Last Vitals:  Vitals:   10/11/17 0708  BP: 105/70  Pulse: 75  Resp: 16  Temp: 36.4 C  SpO2: 100%    Last Pain:  Vitals:   10/11/17 0708  TempSrc: Oral         Complications: No apparent anesthesia complications

## 2017-10-11 NOTE — Anesthesia Postprocedure Evaluation (Signed)
Anesthesia Post Note  Patient: Lauren Ingram  Procedure(s) Performed: Primary CESAREAN SECTION (N/A )     Patient location during evaluation: Mother Baby Anesthesia Type: Spinal Level of consciousness: oriented and awake and alert Pain management: pain level controlled Vital Signs Assessment: post-procedure vital signs reviewed and stable Respiratory status: spontaneous breathing, respiratory function stable and patient connected to nasal cannula oxygen Cardiovascular status: blood pressure returned to baseline and stable Postop Assessment: no headache, no backache and no apparent nausea or vomiting Anesthetic complications: no    Last Vitals:  Vitals:   10/11/17 1335 10/11/17 1435  BP: 102/64 101/61  Pulse: 76 76  Resp: 18 18  Temp: 37 C 36.7 C  SpO2: 98% 97%    Last Pain:  Vitals:   10/11/17 1435  TempSrc: Oral  PainSc: 2    Pain Goal:                 [employer]

## 2017-10-11 NOTE — Anesthesia Procedure Notes (Addendum)
Spinal  Patient location during procedure: OR End time: 10/11/2017 8:56 AM Staffing Anesthesiologist: Annye Asa, MD Performed: anesthesiologist  Preanesthetic Checklist Completed: patient identified, surgical consent, pre-op evaluation, timeout performed, IV checked, risks and benefits discussed and monitors and equipment checked Spinal Block Patient position: sitting Prep: site prepped and draped and DuraPrep Patient monitoring: blood pressure, continuous pulse ox, cardiac monitor and heart rate Approach: midline Location: L3-4 Injection technique: single-shot Needle Needle type: Pencan  Needle gauge: 24 G Needle length: 9 cm Additional Notes Pt identified in Operating room.  Monitors applied. Working IV access confirmed. Sterile prep, drape lumbar spine.  1% lido local L 3,4.  #24ga Pencan into clear CSF L 3,4.  12mg  0.75% Bupivacaine with dextrose, fentanyl, morphine injected with asp CSF beginning and end of injection.  Patient asymptomatic, VSS, no heme aspirated, tolerated well.  Jenita Seashore, MD

## 2017-10-12 ENCOUNTER — Encounter (HOSPITAL_COMMUNITY): Payer: Self-pay | Admitting: Obstetrics & Gynecology

## 2017-10-12 DIAGNOSIS — D62 Acute posthemorrhagic anemia: Secondary | ICD-10-CM | POA: Diagnosis not present

## 2017-10-12 HISTORY — DX: Acute posthemorrhagic anemia: D62

## 2017-10-12 LAB — CBC
HCT: 20.9 % — ABNORMAL LOW (ref 36.0–46.0)
HEMOGLOBIN: 6.8 g/dL — AB (ref 12.0–15.0)
MCH: 29.3 pg (ref 26.0–34.0)
MCHC: 32.5 g/dL (ref 30.0–36.0)
MCV: 90.1 fL (ref 78.0–100.0)
PLATELETS: 151 10*3/uL (ref 150–400)
RBC: 2.32 MIL/uL — AB (ref 3.87–5.11)
RDW: 13.8 % (ref 11.5–15.5)
WBC: 7.5 10*3/uL (ref 4.0–10.5)

## 2017-10-12 LAB — PREPARE RBC (CROSSMATCH)

## 2017-10-12 LAB — BIRTH TISSUE RECOVERY COLLECTION (PLACENTA DONATION)

## 2017-10-12 MED ORDER — ACETAMINOPHEN 325 MG PO TABS
650.0000 mg | ORAL_TABLET | ORAL | Status: DC | PRN
  Administered 2017-10-12 – 2017-10-13 (×2): 650 mg via ORAL
  Filled 2017-10-12 (×2): qty 2

## 2017-10-12 MED ORDER — SODIUM CHLORIDE 0.9 % IV SOLN: Freq: Once | INTRAVENOUS | Status: DC

## 2017-10-12 MED ORDER — DIPHENHYDRAMINE HCL 25 MG PO CAPS
25.0000 mg | ORAL_CAPSULE | Freq: Once | ORAL | Status: DC
  Filled 2017-10-12: qty 1

## 2017-10-12 MED ORDER — ACETAMINOPHEN 325 MG PO TABS
650.0000 mg | ORAL_TABLET | Freq: Once | ORAL | Status: AC
  Administered 2017-10-12: 650 mg via ORAL
  Filled 2017-10-12: qty 2

## 2017-10-12 NOTE — Progress Notes (Signed)
CRITICAL VALUE ALERT  Critical Value:  hemoglobin  Date & Time Notied:  10/12/2017 at [place]  Provider Notified: Derrell Lolling, CNM  Orders Received/Actions taken: No new orders

## 2017-10-12 NOTE — Progress Notes (Signed)
Patient has low hemaglobin but not symptomatic. Patient states her BP runs  Low anyway and she is not dizzyn or light headed. Bleeding per her is ok no blood clots. I told patient if she feels bad or dizzy or her bleeding picks up to let me know. No further orders noted. Patient told about pain regimen. Patient passing flatus

## 2017-10-12 NOTE — Progress Notes (Signed)
First unit in with out complications. 2nd unit started. First unit started at 1100.

## 2017-10-12 NOTE — Lactation Note (Signed)
This note was copied from a baby's chart. Lactation Consultation Note  Patient Name: Lauren Ingram [id number] Date: 10/12/2017 Reason for consult: Initial assessment;Late-preterm 34-36.6wks;Infant weight loss(2% weight loss, LC enc mom to page for feeding assessment )  Baby is 78 hours old  LC reviewed and updated the doc flow sheets per mom and dad.  Doc flow sheets WNL for age.  Mom confirmed 1 small wet diaper  And is aware of the indicator turning blue with urine, also that boys pee in the waist band  Where the strip isn't.  Mom had a [place] , EBL 1268, and is presently getting a unit of PRBC"s.  Per mom has been asymptomatic when OOB, just feeling tired.  MBU - RN set up the DEBP this am due to baby being a LPT - 46 2/7 weeks infant.  LC discussed with mom and dad the reason for added post pumping after at least 4 feedings When the isn't cluster feeding/ save milk and baby can be supplemented back with EBM to boost weight.  Mom pumped for 20 min with 6 ml EBM yield.     Maternal Data Does the patient have breastfeeding experience prior to this delivery?: Yes  Feeding Feeding Type: Breast Fed Length of feed: 45 min  LATCH Score                   Interventions Interventions: Breast feeding basics reviewed  Lactation Tools Discussed/Used     Consult Status Consult Status: Follow-up Date: 10/12/17(mom aware to call for latch assessment ) Follow-up type: In-patient    E. Lopez 10/12/2017, 2:22 PM

## 2017-10-12 NOTE — Progress Notes (Signed)
Dr. Carin Hock notified of patient's low BP and was updated on patients status. Will transfuse per order.

## 2017-10-12 NOTE — Op Note (Signed)
NAMEJASMIA, ANGST                ACCOUNT NO.:  [id number]  MEDICAL RECORD NO.:  [id number]  LOCATION:  [facility]                          FACILITY:  [facility]  PHYSICIAN:  Servando Salina, M.D.DATE OF BIRTH:  February 25, 1986  DATE OF PROCEDURE:  10/11/2017 DATE OF DISCHARGE:                              OPERATIVE REPORT   PREOPERATIVE DIAGNOSES:  Placenta previa, intrauterine gestation at 32- 2/7th weeks.  PROCEDURES:  Primary cesarean section, Kerr hysterotomy.  POSTOPERATIVE DIAGNOSES:  Placenta previa, intrauterine gestation at 79- 2/7th weeks.  ANESTHESIA:  Spinal.  SURGEON:  Servando Salina, M.D.  ASSISTANT:  Derrell Lolling, CNM.  DESCRIPTION OF PROCEDURE:  Under adequate spinal anesthesia, the patient was placed in supine position with a left lateral tilt.  She was sterilely prepped and draped in usual fashion.  An indwelling Foley catheter was sterilely placed.  Marcaine 0.25% was injected along the planned Pfannenstiel skin incision.  Pfannenstiel skin incision was then made, carried down to the rectus fascia.  Rectus fascia opened transversely.  Rectus fascia was then bluntly and sharply dissected off the rectus muscle in a superior and inferior fashion.  The rectus muscles split in the midline.  The parietal peritoneum was entered sharply and extended.  A self-retaining Alexis retractor was then placed.  Vesicouterine peritoneum was carefully opened transversely and the bladder displaced inferiorly with blunt dissection.  A curvilinear low-transverse incision was made on mid prominent vessels on either side and carried down to the amniotic sac, which ruptured clear fluid. Bandage scissor was used to extend the excision.  The placenta was confirmed previa with the edge just at the incision site.  Subsequent delivery of a live female was accomplished with a cord around his right hand.  Baby was bulb suctioned on the abdomen.  Delayed cord clamp. Subsequently, assigned Apgars of  9 and 9 at one and five minutes respectively.  Placenta was partially detaching, subsequently manually removed.  Old clotted blood behind the placenta was noted.  The uterine cavity was cleaned of debris.  The uterine incision was then closed in 2 layers, the first layer was 0 Monocryl running lock stitch, second layer was imbricated using 0 Monocryl suture.  Bleeding on the left inferior aspect of the incision resulted in the figure-of-eight sutures x2 placement.  Small bleeding along the peritoneal edges was cauterized. With hemostasis achieved, the abdomen was irrigated and suctioned of debris.  Normal tubes and ovaries were noted bilaterally.  Surgicel was placed over the lower uterine segment followed by Interceed in inverted T fashion.  The Alexis retractor had been removed at that point.  The parietal peritoneum was then closed with 2-0 Vicryl.  The rectus fascia was closed with 0 Vicryl x2.  The subcutaneous area was irrigated, small bleeders cauterized.  Interrupted 2-0 plain sutures placed and the skin approximated using 4-0 Vicryl subcuticular closure.  Steri-Strips and benzoin placed.  SPECIMEN:  Placenta not sent to Pathology.  ESTIMATED BLOOD LOSS:  1216 ml.  INTRAOPERATIVE FLUID:  1400 mL.  URINE OUTPUT:  200 mL, clear yellow urine.  Sponge and instrument counts x2 were correct.  COMPLICATIONS:  None.  The patient tolerated the procedure well, was transferred to  recovery room in stable condition.     Servando Salina, M.D.     [place]/MEDQ  D:  10/11/2017  T:  10/12/2017  Job:  [id number]

## 2017-10-12 NOTE — Progress Notes (Signed)
Patient tolerated well the 2nd unit. NO complications. Patient feels great.

## 2017-10-12 NOTE — Lactation Note (Signed)
This note was copied from a baby's chart. Lactation Consultation Note Mom sleeping, woke up at intervals when entered rm. FOB holding baby. Gave LPI information sheet. Told FOB to look at it today. Mom looked at [facility], mentioned needing DEBP d/t LPI. Mom stated ok. Asked RN to ask on coming RN to set up today after mom awakens.  Patient Name: Lauren Ingram HENID'P Date: 10/12/2017     Maternal Data    Feeding Feeding Type: Breast Fed Length of feed: 30 min(off and on)  LATCH Score                   Interventions    Lactation Tools Discussed/Used     Consult Status      Lauren Ingram G 10/12/2017, 6:23 AM

## 2017-10-12 NOTE — Progress Notes (Signed)
No c/o; ambulating this am in room w/o c/o; pain controlled; minimal lochia; no lightheadedness/dizziness Voiding w/o difficulty  Patient Vitals for the past 24 hrs:  BP Temp Temp src Pulse Resp SpO2  10/12/17 0730 (!) 87/47 98.2 F (36.8 C) - 61 18 99 %  10/12/17 0337 100/61 98 F (36.7 C) Oral 71 16 99 %  10/12/17 0111 - - - - - 98 %  10/11/17 2319 (!) 108/52 98.1 F (36.7 C) Oral 71 18 99 %  10/11/17 2128 - - - - - 98 %  10/11/17 2012 - - - - - 98 %  10/11/17 1934 - - - - - 97 %  10/11/17 1835 (!) 101/56 98 F (36.7 C) Tympanic - 18 100 %  10/11/17 1735 - - - - - 97 %  10/11/17 1635 - - - - - 99 %  10/11/17 1535 - - - - - 98 %  10/11/17 1435 101/61 98.1 F (36.7 C) Oral 76 18 97 %  10/11/17 1335 102/64 98.6 F (37 C) Oral 76 18 98 %  10/11/17 1235 (!) 100/59 98.2 F (36.8 C) Oral 78 18 100 %  10/11/17 1134 104/64 97.6 F (36.4 C) Oral (!) 59 18 100 %  10/11/17 1125 - - - (!) 59 (!) 22 99 %  10/11/17 1120 - - - (!) 58 16 100 %  10/11/17 1116 103/76 - - 69 16 100 %  10/11/17 1115 - - - (!) 58 16 100 %  10/11/17 1110 - - - (!) 42 16 (!) 84 %  10/11/17 1108 - - - 60 15 99 %  10/11/17 1105 - - - (!) 57 18 100 %  10/11/17 1100 99/65 (!) 97.5 F (36.4 C) Oral 65 19 96 %  10/11/17 1050 - - - - 15 -  10/11/17 1045 100/71 - - 67 (!) 22 100 %  10/11/17 1040 - - - (!) 57 19 100 %  10/11/17 1035 - - - 62 15 100 %  10/11/17 1034 - - - 63 19 100 %  10/11/17 1033 - - - 63 19 100 %  10/11/17 1030 98/72 - - 72 12 100 %  10/11/17 1029 98/72 - - 66 18 99 %  10/11/17 1025 - - - 84 15 (!) 86 %  10/11/17 1020 - - - (!) 56 18 100 %  10/11/17 1015 100/62 97.6 F (36.4 C) Oral 62 19 100 %   A&ox3, appears pale rrr ctab Abd: soft, nt , nd; +bs; fundus firm and 2cm below umb; dressing c/d/i LE: no edema, nt bilat le  CBC Latest Ref Rng & Units 10/12/2017 10/10/2017 09/05/2017  WBC 4.0 - 10.5 K/uL 7.5 6.3 6.8  Hemoglobin 12.0 - 15.0 g/dL 6.8(LL) 9.8(L) 10.2(L)  Hematocrit 36.0 - 46.0  % 20.9(L) 31.4(L) 30.6(L)  Platelets 150 - 400 K/uL 151 177 140(L)   A/p: pod 1 s/p ltcs for placenta previa 1. Severe anemia - chronic anemia with acute change, hypotensive; I have reviewed this with pt and husband and recommend blood transfusion, also reviewing risk/b/a (iv iron, oral therapy) and risk including risk of hiv, hep b, hep c - though very low risk; pt agrees and consent signed; plan transfuse 2 u prbc 2. Post op- meeting milestones; ambulate with caution d/t anemia, contin current care 3. Nursing 4. Rh pos

## 2017-10-12 NOTE — Lactation Note (Addendum)
This note was copied from a baby's chart. Lactation Consultation Note  Patient Name: Lauren Ingram [id number] Date: 10/12/2017 Reason for consult: Initial assessment;Late-preterm 34-36.6wks;Infant weight loss  Baby is 62 hours old 2nd [place] visit for dyad, mom called to request for latch.  Latch score 10, swallows noted, mom comfortable.  Baby's chest and legs STS.     Maternal Data Has patient been taught Hand Expression?: Yes(per mom exp hand expressing ) Does the patient have breastfeeding experience prior to this delivery?: Yes  Feeding Feeding Type: Breast Fed Length of feed: (swallows noted, still feeding at 14 mins )  LATCH Score                   Interventions Interventions: Breast feeding basics reviewed;Skin to skin;DEBP  Lactation Tools Discussed/Used Tools: Pump Breast pump type: Double-Electric Breast Pump WIC Program: No Pump Review: Setup, frequency, and cleaning;Milk Storage Initiated by:: [facility] (set up by the [facility] ) Date initiated:: 10/12/17   Consult Status Consult Status: Follow-up Date: 10/13/17 Follow-up type: In-patient    [place] 10/12/2017, 3:08 PM

## 2017-10-13 LAB — BPAM RBC
BLOOD PRODUCT EXPIRATION DATE: 201812252359
BLOOD PRODUCT EXPIRATION DATE: 201901062359
ISSUE DATE / TIME: 201812131329
ISSUE DATE / TIME: 201812131044
UNIT TYPE AND RH: 6200
Unit Type and Rh: 6200

## 2017-10-13 LAB — TYPE AND SCREEN
ABO/RH(D): A POS
ANTIBODY SCREEN: NEGATIVE
UNIT DIVISION: 0
Unit division: 0

## 2017-10-13 LAB — CBC
HCT: 28.6 % — ABNORMAL LOW (ref 36.0–46.0)
Hemoglobin: 9.2 g/dL — ABNORMAL LOW (ref 12.0–15.0)
MCH: 28.8 pg (ref 26.0–34.0)
MCHC: 32.2 g/dL (ref 30.0–36.0)
MCV: 89.7 fL (ref 78.0–100.0)
Platelets: 159 10*3/uL (ref 150–400)
RBC: 3.19 MIL/uL — ABNORMAL LOW (ref 3.87–5.11)
RDW: 14.6 % (ref 11.5–15.5)
WBC: 7.6 10*3/uL (ref 4.0–10.5)

## 2017-10-13 MED ORDER — IBUPROFEN 200 MG PO TABS: 600.0000 mg | ORAL_TABLET | Freq: Four times a day (QID) | ORAL | 0 refills | Status: DC | PRN

## 2017-10-13 MED ORDER — FERROUS SULFATE 325 (65 FE) MG PO TABS: 325.0000 mg | ORAL_TABLET | Freq: Every day | ORAL | 0 refills | Status: DC

## 2017-10-13 MED ORDER — ACETAMINOPHEN 500 MG PO TABS: 500.0000 mg | ORAL_TABLET | Freq: Four times a day (QID) | ORAL | 0 refills | Status: DC | PRN

## 2017-10-13 NOTE — Discharge Instructions (Signed)

## 2017-10-13 NOTE — Progress Notes (Signed)
Post Partum Day 2. C/section for previa. Acute blood loss and s/p 2 units pRBCs.   Subjective: no complaints, up ad lib, voiding, tolerating PO and feels well since transfusion, tolerated it well,   Objective: Blood pressure (!) 99/57, pulse 86, temperature 98.2 F (36.8 C), temperature source Oral, resp. rate 18, height 5\' 7"  (1.702 m), weight 138 lb (62.6 kg), SpO2 99 %, unknown if currently breastfeeding.  Physical Exam:  General: alert and cooperative Lochia: appropriate Uterine Fundus: firm Incision: healing well DVT Evaluation: No evidence of DVT seen on physical exam.  CBC Latest Ref Rng & Units 10/12/2017 10/12/2017 10/10/2017  WBC 4.0 - 10.5 K/uL 7.6 7.5 6.3  Hemoglobin 12.0 - 15.0 g/dL 9.2(L) 6.8(LL) 9.8(L)  Hematocrit 36.0 - 46.0 % 28.6(L) 20.9(L) 31.4(L)  Platelets 150 - 400 K/uL 159 151 177   A pos Rub Imm.   Female infant 36.2 wk delivery. Doing well, in room with mother, likely discharge today.   Assessment/Plan: Discharge home and Breastfeeding Post-op and PP care and warning s/s reviewed    LOS: 2 days   Lauren Ingram 10/13/2017, 8:32 AM

## 2017-10-13 NOTE — Discharge Summary (Signed)
Obstetric Discharge Summary Reason for Admission: cesarean section at 36.2 weeks for placenta previa  Prenatal Procedures: ultrasound Intrapartum Procedures: Low Transverse C-section.  Postpartum Procedures: transfusion 2 packed red cells  Complications-Operative and Postpartum: hemorrhage at surgery   CBC Latest Ref Rng & Units 10/12/2017 10/12/2017 10/10/2017  WBC 4.0 - 10.5 K/uL 7.6 7.5 6.3  Hemoglobin 12.0 - 15.0 g/dL 9.2(L) 6.8(LL) 9.8(L)  Hematocrit 36.0 - 46.0 % 28.6(L) 20.9(L) 31.4(L)  Platelets 150 - 400 K/uL 159 151 177    Physical Exam:  General: alert and cooperative Lochia: appropriate Uterine Fundus: firm Incision: healing well DVT Evaluation: No evidence of DVT seen on physical exam.  Discharge Diagnoses: Preterm C-section, 36.2 wks for placenta previa. Acute blood loss anemia at surgery and s/p blood transfusion x 2 units pRBCs  Discharge Information: Date: 10/13/2017 Activity: pelvic rest Diet: routine Medications: PNV, Ibuprofen, Iron and Tylenol Condition: stable and improved Instructions: refer to practice specific booklet Discharge to: home Follow-up Information    Servando Salina, MD Follow up in 6 week(s).   Specialty:  Obstetrics and Gynecology Contact information: [address] [name] [address] [phone]           Newborn Data: Live born female  Birth Weight: 7 lb 6.3 oz (3355 g) APGAR: 49, 9  Newborn Delivery   Birth date/time:  10/11/2017 09:19:00 Delivery type:  C-Section, Low Transverse C-section categorization:  Primary    Home with mother.  Post-op and postpartum care reviewed.    Elveria Royals 10/13/2017, 12:50 PM

## 2017-10-13 NOTE — Lactation Note (Signed)
This note was copied from a baby's chart. Lactation Consultation Note  Patient Name: Lauren Ingram [id number]'P Date: 10/13/2017 Reason for consult: Follow-up assessment;Infant weight loss(5 % weight loss )  50 hours old  LC reviewed doc flow sheets and updated per mom.  Per mom baby had fed the 1st breast 20 mins , and is on the 2nd breast , multiple  Swallows observed. Mom mentioned she was experiencing soreness.  LC offered her comfort gels , breast shells , hand pump for pre-pumping and mom declined.  LC encouraged mom to use her EBM liberally on her nipples , and increase calories due to  Low Hbg/ prenatal vitamins.  Sore nipple and engorgement prevention and tx reviewed.  Per mom will have her DEBP at home.  Mother informed of post-discharge support and given phone number to the lactation department, including services for phone call assistance; out-patient appointments; and breastfeeding support group. List of other breastfeeding resources in the community given in the handout. Encouraged mother to call for problems or concerns related to breastfeeding.   Maternal Data    Feeding Feeding Type: Breast Fed Length of feed: 12 min(LC noted multiple swallows )  LATCH Score Latch: (latched with depth )  Audible Swallowing: (multiple swallows )     Comfort (Breast/Nipple): (multiple swallows )  Hold (Positioning): (independent )     Interventions Interventions: Breast feeding basics reviewed;Assisted with latch;Skin to skin;Breast massage;Breast compression  Lactation Tools Discussed/Used     Consult Status Consult Status: Complete Date: 10/13/17    Lauren Ingram 10/13/2017, 11:44 AM

## 2017-11-26 IMAGING — NM NM HEPATO W/GB/PHARM/[PERSON_NAME]
3 series · 18 of 18 positions shown · non-contrast
Comparison: None

CLINICAL DATA: RIGHT upper quadrant pain for 4 months with nausea
and diarrhea

EXAM:
NUCLEAR MEDICINE HEPATOBILIARY IMAGING WITH GALLBLADDER EF
TECHNIQUE: Sequential images of the abdomen were obtained [DATE] minutes
following intravenous administration of radiopharmaceutical. After
oral ingestion of Ensure, gallbladder ejection fraction was
determined. At 60 min, normal ejection fraction is greater than 33%.
RADIOPHARMACEUTICALS:  4.95 mCi Jc-CCm  Choletec IV

[Series 1000: gallbladder ef dynamic · 4.80mm/px · 6 of 120 frames shown]
[frame 11/120]
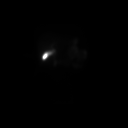
[frame 31/120]
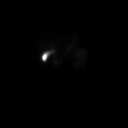
[frame 51/120]
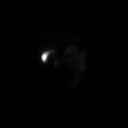
[frame 71/120]
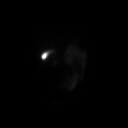
[frame 91/120]
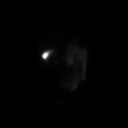
[frame 111/120]
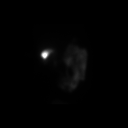

[Series 1000: gallbladder ef dynamic (results) · 4.80mm/px · 6 of 120 frames shown]
[frame 11/120]
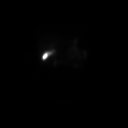
[frame 31/120]
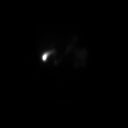
[frame 51/120]
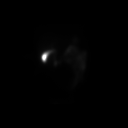
[frame 71/120]
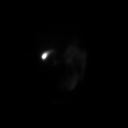
[frame 91/120]
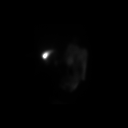
[frame 111/120]
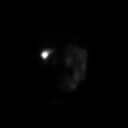

[Series 1000: hepatobiliary dynamic · 9.59mm/px · 6 of 60 frames shown]
[frame 6/60]
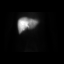
[frame 16/60]
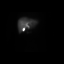
[frame 26/60]
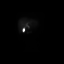
[frame 36/60]
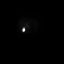
[frame 46/60]
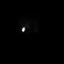
[frame 56/60]
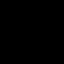

[18 of 18 positions shown; findings below may reference images not displayed]

FINDINGS: Normal tracer extraction from bloodstream indicating normal
hepatocellular function.

Normal excretion of tracer into biliary tree.

Gallbladder visualized at 11 min.

Small bowel visualized at 19 min.

No hepatic retention of tracer.

Patient motion artifacts degrade image quality on the post Ensure
imaging.

Subjectively normal emptying of tracer from gallbladder following
fatty meal stimulation.

Calculated gallbladder ejection fraction is 42%, normal.

Patient reported RIGHT upper quadrant pain following Ensure
ingestion.

Normal gallbladder ejection fraction following Ensure ingestion is
greater than 33% at 1 hour.
IMPRESSION: Patent biliary tree.

Normal gallbladder response to use fatty meal stimulation with a
normal gallbladder ejection fraction of 42%.

Patient reported RIGHT upper quadrant pain following Ensure
ingestion.

## 2017-11-27 DIAGNOSIS — Z130 Encounter for screening for diseases of the blood and blood-forming organs and certain disorders involving the immune mechanism: Secondary | ICD-10-CM | POA: Diagnosis not present

## 2017-11-27 DIAGNOSIS — Z124 Encounter for screening for malignant neoplasm of cervix: Secondary | ICD-10-CM | POA: Diagnosis not present

## 2017-11-27 DIAGNOSIS — Z1151 Encounter for screening for human papillomavirus (HPV): Secondary | ICD-10-CM | POA: Diagnosis not present

## 2017-11-29 DIAGNOSIS — D485 Neoplasm of uncertain behavior of skin: Secondary | ICD-10-CM | POA: Diagnosis not present

## 2017-11-29 DIAGNOSIS — L905 Scar conditions and fibrosis of skin: Secondary | ICD-10-CM | POA: Diagnosis not present

## 2017-11-29 DIAGNOSIS — D225 Melanocytic nevi of trunk: Secondary | ICD-10-CM | POA: Diagnosis not present

## 2017-11-29 DIAGNOSIS — C4401 Basal cell carcinoma of skin of lip: Secondary | ICD-10-CM | POA: Diagnosis not present

## 2018-01-15 DIAGNOSIS — C4401 Basal cell carcinoma of skin of lip: Secondary | ICD-10-CM | POA: Insufficient documentation

## 2018-01-15 DIAGNOSIS — L578 Other skin changes due to chronic exposure to nonionizing radiation: Secondary | ICD-10-CM | POA: Diagnosis not present

## 2018-01-15 DIAGNOSIS — L814 Other melanin hyperpigmentation: Secondary | ICD-10-CM | POA: Diagnosis not present

## 2018-01-15 DIAGNOSIS — L988 Other specified disorders of the skin and subcutaneous tissue: Secondary | ICD-10-CM | POA: Diagnosis not present

## 2018-02-22 DIAGNOSIS — O9122 Nonpurulent mastitis associated with the puerperium: Secondary | ICD-10-CM | POA: Diagnosis not present

## 2018-04-03 DIAGNOSIS — J019 Acute sinusitis, unspecified: Secondary | ICD-10-CM | POA: Diagnosis not present

## 2018-04-19 DIAGNOSIS — Z85828 Personal history of other malignant neoplasm of skin: Secondary | ICD-10-CM | POA: Diagnosis not present

## 2018-04-19 DIAGNOSIS — D2272 Melanocytic nevi of left lower limb, including hip: Secondary | ICD-10-CM | POA: Diagnosis not present

## 2018-04-19 DIAGNOSIS — D2262 Melanocytic nevi of left upper limb, including shoulder: Secondary | ICD-10-CM | POA: Diagnosis not present

## 2018-04-19 DIAGNOSIS — D225 Melanocytic nevi of trunk: Secondary | ICD-10-CM | POA: Diagnosis not present

## 2018-05-22 DIAGNOSIS — J029 Acute pharyngitis, unspecified: Secondary | ICD-10-CM | POA: Diagnosis not present

## 2018-09-09 IMAGING — US US MFM OB COMP +14 WKS
1 series · 13 of 28 positions shown · non-contrast
Comparison: none

[Series 1: us mfm ob comp +14 wks · 13 of 77 slices shown]
[im 3/77]
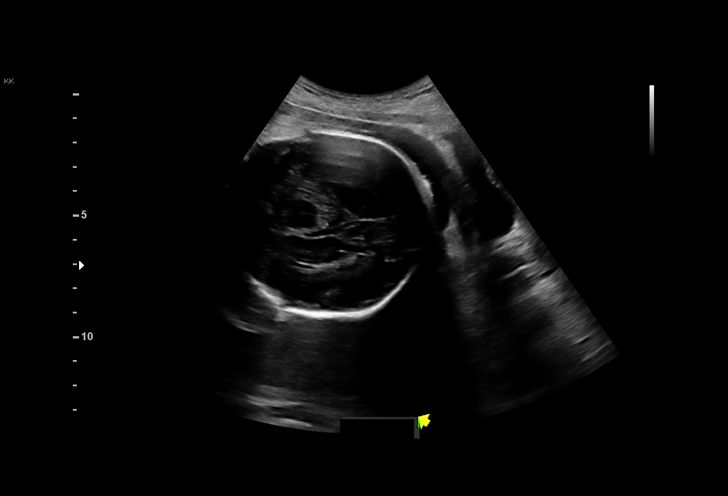
[im 9/77]
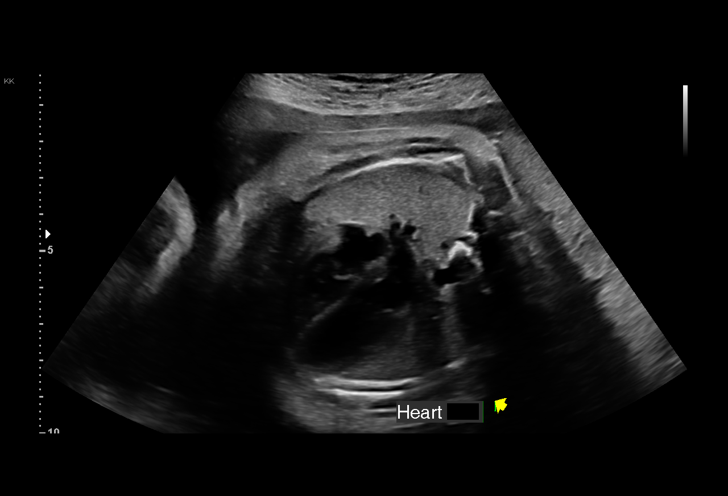
[im 15/77]
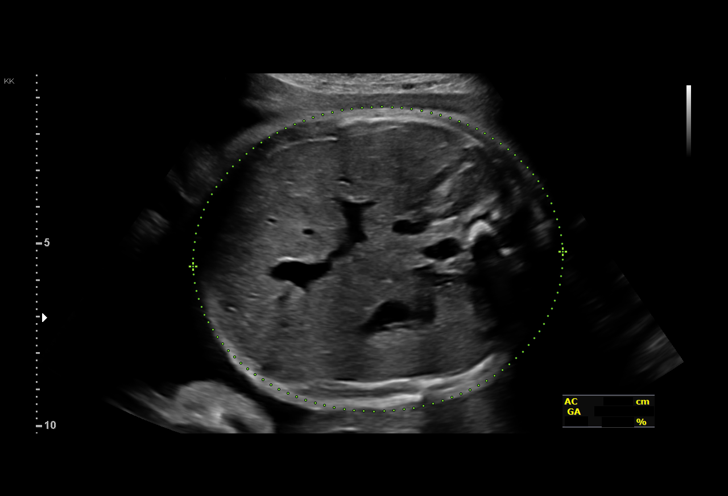
[im 20/77]
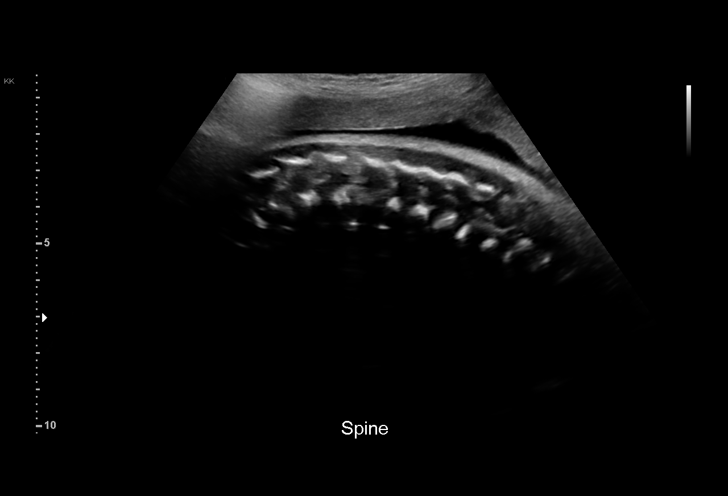
[im 26/77]
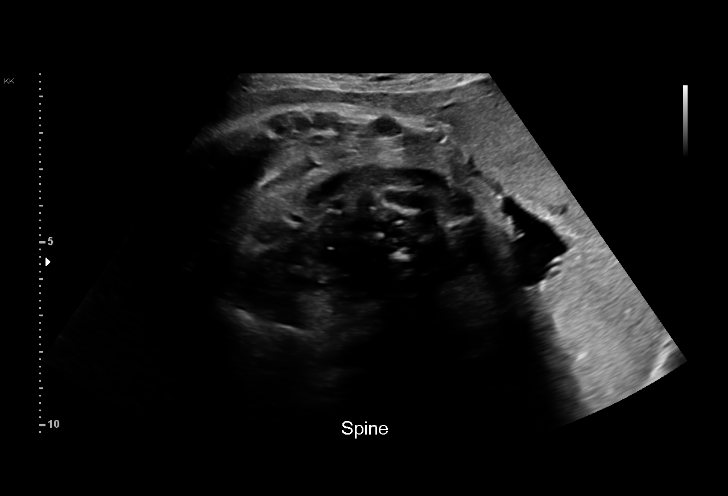
[im 31/77]
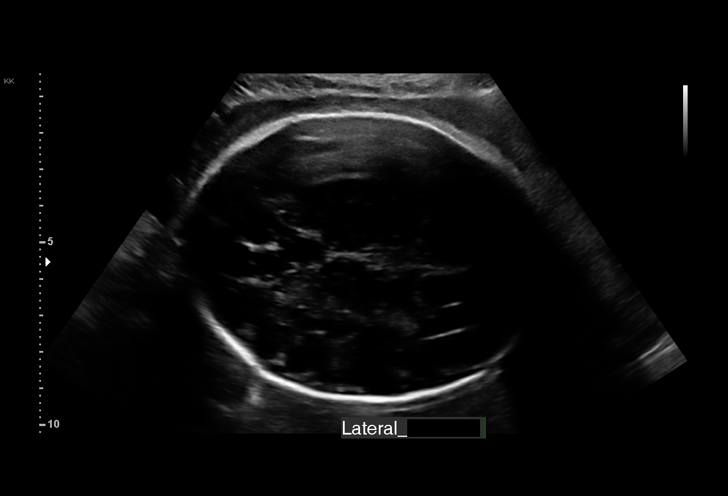
[im 40/77]
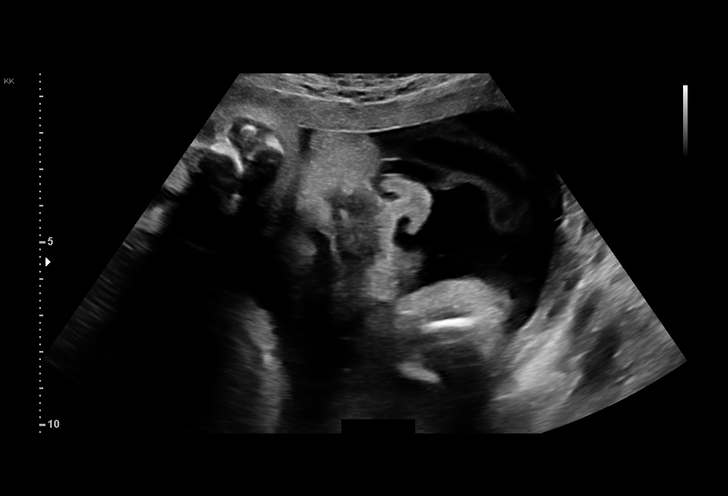
[im 46/77]
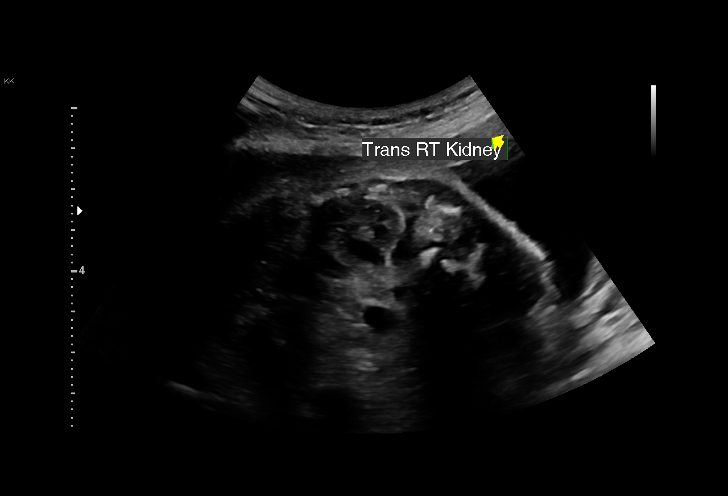
[im 51/77]
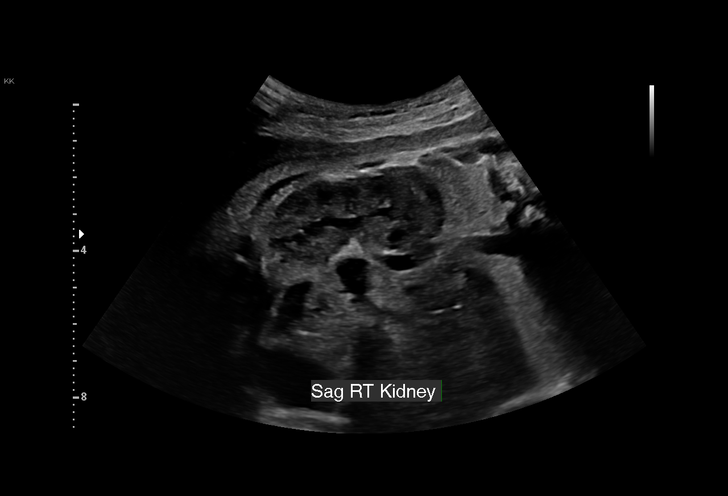
[im 57/77]
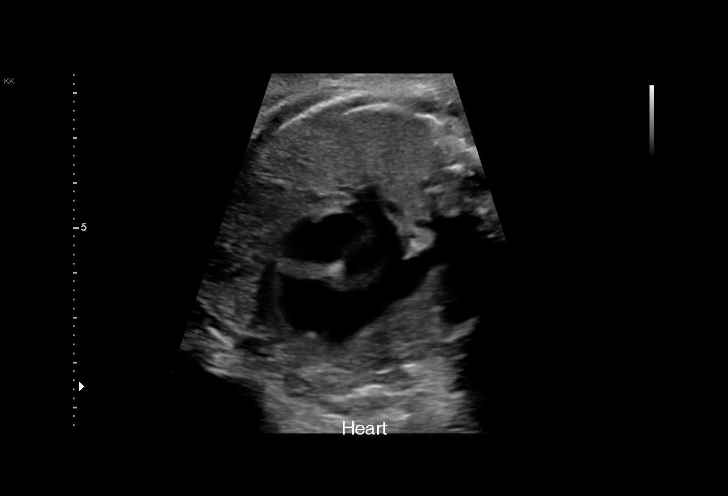
[im 62/77]
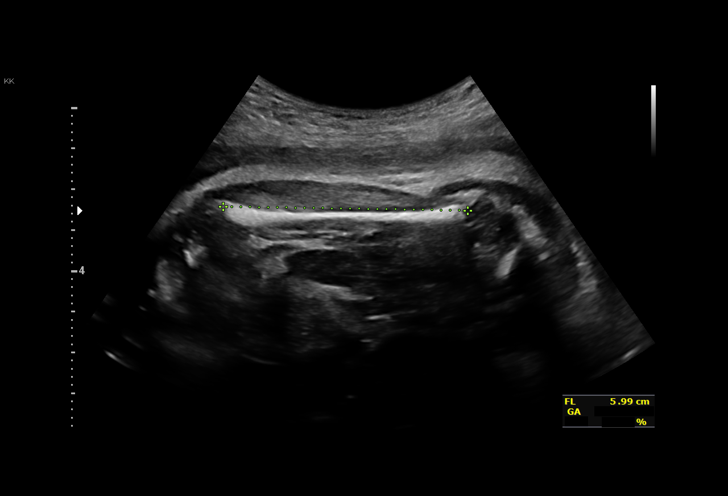
[im 68/77]
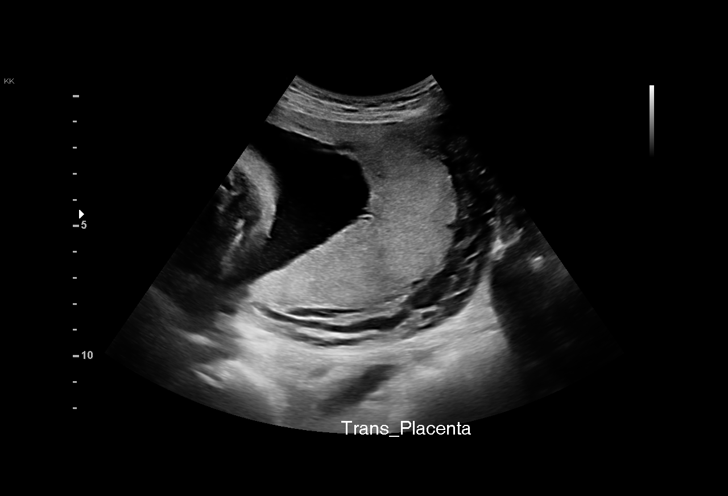
[im 74/77]
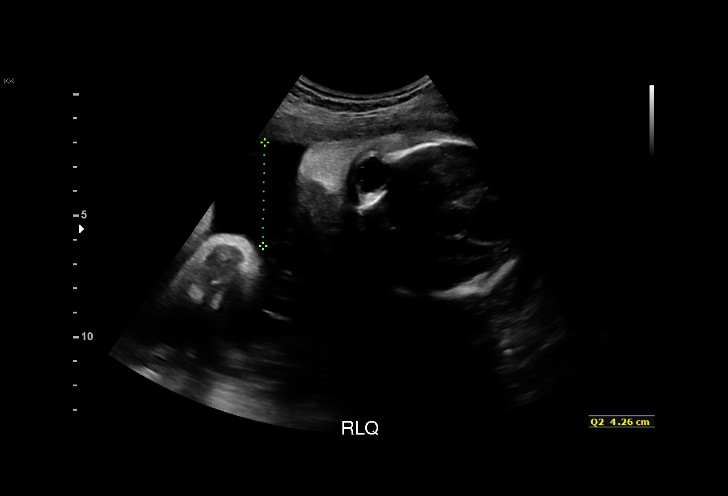

[13 of 28 positions shown; findings below may reference images not displayed]

[facility]
Attending:        Jhojan Estiven Arake         Secondary Phy.:   3rd Nursing- HR
OB

1  FESTA               [id number]      [id number]     [id number]
HANNES
Indications

31 weeks gestation of pregnancy
Encounter for antenatal screening for
malformations
Placenta previa with hemorrhage, third
trimester
Thrombocytopenia affecting pregnancy,          O99.119,
antepartum

OB History

Blood Type:            Height:  5'7"   Weight (lb):  135       BMI:
Gravidity:    6         Term:   3        Prem:   0        SAB:   2
TOP:          0       Ectopic:  0        Living: 3
Fetal Evaluation

Num Of Fetuses:     1
Fetal Heart         143
Rate(bpm):
Cardiac Activity:   Observed
Presentation:       Cephalic
Placenta:           Asymmetric Complete Previa
P. Cord Insertion:  Not well visualized
Amniotic Fluid
AFI FV:      Subjectively within normal limits

AFI Sum(cm)     %Tile       Largest Pocket(cm)
15.87           57

RUQ(cm)       RLQ(cm)       LUQ(cm)        LLQ(cm)
7.04
Biometry

BPD:      77.6  mm     G. Age:  31w 1d         39  %    CI:        74.85   %    70 - 86
FL/HC:      21.2   %    19.3 -
HC:      284.6  mm     G. Age:  31w 2d         18  %    HC/AC:      0.98        0.96 -
AC:      289.2  mm     G. Age:  32w 6d         90  %    FL/BPD:     77.8   %    71 - 87
FL:       60.4  mm     G. Age:  31w 3d         44  %    FL/AC:      20.9   %    20 - 24

Est. FW:    7679  gm      4 lb 3 oz     75  %
Gestational Age

Clinical EDD:  31w 1d                                        EDD:   11/06/17
U/S Today:     31w 5d                                        EDD:   11/02/17
Best:          31w 1d     Det. By:  Clinical EDD             EDD:   11/06/17
Anatomy

Cranium:               Appears normal         Aortic Arch:            Not well visualized
Cavum:                 Appears normal         Ductal Arch:            Not well visualized
Ventricles:            Appears normal         Diaphragm:              Appears normal
Choroid Plexus:        Appears normal         Stomach:                Appears normal, left
sided
Cerebellum:            Appears normal         Abdomen:                Appears normal
Posterior Fossa:       Appears normal         Abdominal Wall:         Not well visualized
Nuchal Fold:           Not applicable (>20    Cord Vessels:           Appears normal (3
wks GA)                                        vessel cord)
Face:                  Appears normal         Kidneys:                Appear normal
(orbits and profile)
Lips:                  Appears normal         Bladder:                Appears normal
Thoracic:              Appears normal         Spine:                  Limited views
appear normal
Heart:                 Appears normal         Upper Extremities:      Not well visualized
(4CH, axis, and situs
RVOT:                  Appears normal         Lower Extremities:      Not well visualized
LVOT:                  Appears normal

Other:  Parents do not wish to know sex of fetus. Complete fetal anatomic
survey previously performed in office. Technically difficult due to
advanced GA and fetal position.
Cervix Uterus Adnexa

Cervix
Not visualized (advanced GA >69wks)
Impression

Singleton intrauterine pregnancy at 31+1 weeks with known
previa
Review of the anatomy shows no sonographic markers for
aneuploidy or structural anomalies
However, aortic arch and distal extremity evaluations should
be considered suboptimal secondary to late gestational age
and fetal position
Amniotic fluid volume is normal with an AFI of 15.9 cm
Estimated fetal weight is 1912g which is growth in the 75th
percentile
Recommendations

Continue clinical evaluation and management

## 2018-11-02 ENCOUNTER — Ambulatory Visit (INDEPENDENT_AMBULATORY_CARE_PROVIDER_SITE_OTHER): Payer: BLUE CROSS/BLUE SHIELD | Admitting: Family Medicine

## 2018-11-02 ENCOUNTER — Encounter: Payer: Self-pay | Admitting: Family Medicine

## 2018-11-02 VITALS — BP 100/66 | HR 62 | Temp 98.0°F | Ht 66.5 in | Wt 138.0 lb

## 2018-11-02 DIAGNOSIS — G44229 Chronic tension-type headache, not intractable: Secondary | ICD-10-CM | POA: Diagnosis not present

## 2018-11-02 NOTE — Progress Notes (Signed)
Subjective:    Patient ID: Lauren Ingram, female    DOB: Jun 10, 1986, 33 y.o.   MRN: [id number]   HPI   Patient presents to clinic to establish with PCP.  She had been seeing her OB/GYN, but due to having daily headache she decided it is time to get PCP.  Patient states the headaches first began a few years ago when she was pregnant with her third child.  Patient noticed the headaches always were on the right side of her head starting above the eye and wrapping around to back of head.  Currently she is breast-feeding her fourth child, who is 83 months old, so she does not take many medications.  States she will use a Tylenol on occasion but otherwise does not take any other headache treatments.  Patient does wonder if the headaches could be related to stress at home, and hormonal fluctuations.  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  Denies any loss of vision, speech difficulty, one-sided weakness, dizziness, or aura with her headaches.  Headaches did not seem to go along with any particular activity, states she will just have a headache every day.  Patient Active Problem List   Diagnosis Date Noted  . Postoperative anemia due to acute blood loss 10/12/2017  . Placenta previa affecting delivery 10/11/2017  . Postpartum care following cesarean delivery 10/11/2017  . Cesarean delivery 12/12 10/11/2017  . Iron deficiency anemia of pregnancy 10/24/2015   Social History   Tobacco Use  . Smoking status: Never Smoker  . Smokeless tobacco: Never Used  Substance Use Topics  . Alcohol use: No   Past Surgical History:  Procedure Laterality Date  . CESAREAN SECTION    . CESAREAN SECTION N/A 10/11/2017   Procedure: Primary CESAREAN SECTION;  Surgeon: Servando Salina, MD;  Location: [facility];  Service: Obstetrics;  Laterality: N/A;  EDD: 11/06/17  . CESAREAN SECTION  2018  . NO PAST SURGERIES     Family History  Problem  Relation Age of Onset  . Heart disease Maternal Grandfather   . Hypertension Maternal Grandfather   . Diabetes Maternal Grandfather   . Heart disease Paternal Grandfather   . Hypertension Paternal Grandfather   . Diabetes Paternal Grandfather   . Hypertension Maternal Grandmother   . Hypertension Paternal Grandmother    Review of Systems  Constitutional: Negative for chills, fatigue and fever.  HENT: Negative for congestion, ear pain, sinus pain and sore throat.   Eyes: Negative.   Respiratory: Negative for cough, shortness of breath and wheezing.   Cardiovascular: Negative for chest pain, palpitations and leg swelling.  Gastrointestinal: Negative for abdominal pain, diarrhea, nausea and vomiting.  Genitourinary: Negative for dysuria, frequency and urgency.  Musculoskeletal: Negative for arthralgias and myalgias.  Skin: Negative for color change, pallor and rash.  Neurological: Negative for syncope, light-headedness. +headaches  Psychiatric/Behavioral: The patient is not nervous/anxious.       Objective:   Physical Exam  Constitutional: She appears well-developed and well-nourished. No distress.  HENT:  Head: Normocephalic and atraumatic.  Eyes: Pupils are equal, round, and reactive to light. EOM are normal. No scleral icterus.  Neck: Normal range of motion. Neck supple. No tracheal deviation present.  Cardiovascular: Normal rate, regular rhythm and normal heart sounds.  Pulmonary/Chest: Effort normal and breath sounds normal. No respiratory distress. She has no wheezes. She has no rales.  Abdominal: Soft. Bowel sounds are normal. There  is no tenderness.  Neurological: She is alert and oriented to person, place, and time.  Speech clear.  Grips equal and strong.  Dorsi plantar flexion equal and strong.  Can raise eyebrows, puff out cheeks, clenched teeth without issue. Gait normal  Skin: Skin is warm and dry. No pallor.  Psychiatric: She has a normal mood and affect. Her behavior  is normal. Thought content normal.   Nursing note and vitals reviewed.    Vitals:   11/02/18 1418  BP: 100/66  Pulse: 62  Temp: 98 F (36.7 C)  SpO2: 99%   Assessment & Plan:   Chronic headache - due to description of patient's symptoms I suspect that her headache is a tension type headache.  We will do lab work to further investigate possible causes including CBC, CMP, thyroid level, vitamin D and B12.  Also discussed option of doing MRI imaging of the brain to rule out any other abnormalities in head.  Patient states she will consider MRI imaging after lab results are back.  Patient's medication options are limited due to currently breast-feeding.  Once patient has weaned her child from breast-feeding, currently in the process of weaning, she will trial over-the-counter Excedrin to see if this helps her headache.  Also discussed being sure to keep self well-hydrated, eating a balanced diet, and trying to get good amount of sleep every night.  We will base need for next follow-up appointment off of lab results.  Patient aware she can return to clinic anytime if issues arise.

## 2018-11-03 ENCOUNTER — Encounter: Payer: Self-pay | Admitting: Family Medicine

## 2018-11-03 DIAGNOSIS — M545 Low back pain: Secondary | ICD-10-CM | POA: Diagnosis not present

## 2018-11-03 DIAGNOSIS — K5900 Constipation, unspecified: Secondary | ICD-10-CM | POA: Diagnosis not present

## 2018-11-03 DIAGNOSIS — J029 Acute pharyngitis, unspecified: Secondary | ICD-10-CM | POA: Diagnosis not present

## 2018-11-03 DIAGNOSIS — R1011 Right upper quadrant pain: Secondary | ICD-10-CM | POA: Diagnosis not present

## 2018-11-03 DIAGNOSIS — R109 Unspecified abdominal pain: Secondary | ICD-10-CM | POA: Diagnosis not present

## 2018-11-03 DIAGNOSIS — R51 Headache: Secondary | ICD-10-CM | POA: Diagnosis not present

## 2018-11-03 DIAGNOSIS — M549 Dorsalgia, unspecified: Secondary | ICD-10-CM | POA: Diagnosis not present

## 2018-11-03 DIAGNOSIS — N61 Mastitis without abscess: Secondary | ICD-10-CM

## 2018-11-03 DIAGNOSIS — N912 Amenorrhea, unspecified: Secondary | ICD-10-CM | POA: Diagnosis not present

## 2018-11-03 LAB — COMPREHENSIVE METABOLIC PANEL
AG RATIO: 2.1 (calc) (ref 1.0–2.5)
ALT: 11 U/L (ref 6–29)
AST: 17 U/L (ref 10–30)
Albumin: 4.6 g/dL (ref 3.6–5.1)
Alkaline phosphatase (APISO): 75 U/L (ref 33–115)
BUN: 10 mg/dL (ref 7–25)
CHLORIDE: 106 mmol/L (ref 98–110)
CO2: 24 mmol/L (ref 20–32)
Calcium: 9.6 mg/dL (ref 8.6–10.2)
Creat: 0.71 mg/dL (ref 0.50–1.10)
Globulin: 2.2 g/dL (calc) (ref 1.9–3.7)
Glucose, Bld: 82 mg/dL (ref 65–99)
Potassium: 4.8 mmol/L (ref 3.5–5.3)
Sodium: 141 mmol/L (ref 135–146)
Total Bilirubin: 0.4 mg/dL (ref 0.2–1.2)
Total Protein: 6.8 g/dL (ref 6.1–8.1)

## 2018-11-03 LAB — THYROID PANEL WITH TSH
Free Thyroxine Index: 2.5 (ref 1.4–3.8)
T3 Uptake: 30 % (ref 22–35)
T4, Total: 8.4 ug/dL (ref 5.1–11.9)
TSH: 1.39 mIU/L

## 2018-11-03 LAB — CBC
HCT: 39 % (ref 35.0–45.0)
Hemoglobin: 12.9 g/dL (ref 11.7–15.5)
MCH: 29.5 pg (ref 27.0–33.0)
MCHC: 33.1 g/dL (ref 32.0–36.0)
MCV: 89 fL (ref 80.0–100.0)
MPV: 11.5 fL (ref 7.5–12.5)
Platelets: 178 10*3/uL (ref 140–400)
RBC: 4.38 10*6/uL (ref 3.80–5.10)
RDW: 12.5 % (ref 11.0–15.0)
WBC: 4.9 10*3/uL (ref 3.8–10.8)

## 2018-11-03 LAB — B12 AND FOLATE PANEL
FOLATE: 22.9 ng/mL
Vitamin B-12: 380 pg/mL (ref 200–1100)

## 2018-11-03 LAB — VITAMIN D 25 HYDROXY (VIT D DEFICIENCY, FRACTURES): Vit D, 25-Hydroxy: 27 ng/mL — ABNORMAL LOW (ref 30–100)

## 2018-11-04 DIAGNOSIS — K5900 Constipation, unspecified: Secondary | ICD-10-CM | POA: Diagnosis not present

## 2018-11-08 ENCOUNTER — Other Ambulatory Visit: Payer: Self-pay | Admitting: Family Medicine

## 2018-11-08 DIAGNOSIS — M542 Cervicalgia: Secondary | ICD-10-CM

## 2018-11-08 DIAGNOSIS — G44229 Chronic tension-type headache, not intractable: Secondary | ICD-10-CM

## 2018-11-08 MED ORDER — CYCLOBENZAPRINE HCL 5 MG PO TABS: 5.0000 mg | ORAL_TABLET | Freq: Three times a day (TID) | ORAL | 1 refills | Status: DC | PRN

## 2018-11-14 MED ORDER — DICLOXACILLIN SODIUM 500 MG PO CAPS: 500.0000 mg | ORAL_CAPSULE | Freq: Four times a day (QID) | ORAL | 0 refills | Status: DC

## 2018-12-05 DIAGNOSIS — Z124 Encounter for screening for malignant neoplasm of cervix: Secondary | ICD-10-CM | POA: Diagnosis not present

## 2018-12-05 DIAGNOSIS — Z113 Encounter for screening for infections with a predominantly sexual mode of transmission: Secondary | ICD-10-CM | POA: Diagnosis not present

## 2018-12-05 DIAGNOSIS — Z6821 Body mass index (BMI) 21.0-21.9, adult: Secondary | ICD-10-CM | POA: Diagnosis not present

## 2018-12-05 DIAGNOSIS — Z01419 Encounter for gynecological examination (general) (routine) without abnormal findings: Secondary | ICD-10-CM | POA: Diagnosis not present

## 2018-12-05 DIAGNOSIS — Z1151 Encounter for screening for human papillomavirus (HPV): Secondary | ICD-10-CM | POA: Diagnosis not present

## 2018-12-24 DIAGNOSIS — D2261 Melanocytic nevi of right upper limb, including shoulder: Secondary | ICD-10-CM | POA: Diagnosis not present

## 2018-12-24 DIAGNOSIS — D2271 Melanocytic nevi of right lower limb, including hip: Secondary | ICD-10-CM | POA: Diagnosis not present

## 2018-12-24 DIAGNOSIS — Z85828 Personal history of other malignant neoplasm of skin: Secondary | ICD-10-CM | POA: Diagnosis not present

## 2018-12-24 DIAGNOSIS — D2262 Melanocytic nevi of left upper limb, including shoulder: Secondary | ICD-10-CM | POA: Diagnosis not present

## 2019-01-28 ENCOUNTER — Ambulatory Visit: Payer: Self-pay | Admitting: Family Medicine

## 2019-01-28 NOTE — Telephone Encounter (Signed)
Last night I started feeling warm, weak, chills nothing really bad.   I woke up this morning with a tickle in my throat and a cough and a little sick on my stomach.  I thought I should call. My youngest son has been sick with a virus and fever per the doctor.  My temperature 98.4, No chest discomfort.  It's a dry cough.   I feel like some drainage maybe in my throat.  I felt weak and tired.   Tickle in my throat.  See triage notes.  Went over home care advice.  Reason for Disposition . Cold with no complications  Answer Assessment - Initial Assessment Questions 1. ONSET: "When did the nasal discharge start?"      No runny nose. 2. AMOUNT: "How much discharge is there?"      None 3. COUGH: "Do you have a cough?" If yes, ask: "Describe the color of your sputum" (clear, white, yellow, green)     Dry cough 4. RESPIRATORY DISTRESS: "Describe your breathing."      No problem 5. FEVER: "Do you have a fever?" If so, ask: "What is your temperature, how was it measured, and when did it start?"     98.4 6. SEVERITY: "Overall, how bad are you feeling right now?" (e.g., doesn't interfere with normal activities, staying home from school/work, staying in bed)      Tired and weak 7. OTHER SYMPTOMS: "Do you have any other symptoms?" (e.g., sore throat, earache, wheezing, vomiting)     Tickle in my throat, a dry cough that's not bad and feeling a little sick on my stomach.   My youngest son is sick with a viral infection per his doctor.  I might be getting it.   My whole family has been in the house for 14 days now.   I just wanted to make sure it wasn't the coronavirus with the pandemic going on. 8. PREGNANCY: "Is there any chance you are pregnant?" "When was your last menstrual period?"     Not asked.  Protocols used: COMMON COLD-A-AH

## 2019-03-22 DIAGNOSIS — N920 Excessive and frequent menstruation with regular cycle: Secondary | ICD-10-CM | POA: Diagnosis not present

## 2019-03-22 DIAGNOSIS — R1032 Left lower quadrant pain: Secondary | ICD-10-CM | POA: Diagnosis not present

## 2019-04-29 DIAGNOSIS — T300 Burn of unspecified body region, unspecified degree: Secondary | ICD-10-CM | POA: Diagnosis not present

## 2019-06-18 DIAGNOSIS — S76011A Strain of muscle, fascia and tendon of right hip, initial encounter: Secondary | ICD-10-CM | POA: Diagnosis not present

## 2019-07-01 DIAGNOSIS — M25551 Pain in right hip: Secondary | ICD-10-CM | POA: Diagnosis not present

## 2019-07-01 DIAGNOSIS — S76011D Strain of muscle, fascia and tendon of right hip, subsequent encounter: Secondary | ICD-10-CM | POA: Diagnosis not present

## 2019-07-10 DIAGNOSIS — S76011D Strain of muscle, fascia and tendon of right hip, subsequent encounter: Secondary | ICD-10-CM | POA: Diagnosis not present

## 2019-07-10 DIAGNOSIS — M25551 Pain in right hip: Secondary | ICD-10-CM | POA: Diagnosis not present

## 2019-07-15 DIAGNOSIS — M25551 Pain in right hip: Secondary | ICD-10-CM | POA: Diagnosis not present

## 2019-07-15 DIAGNOSIS — S76011D Strain of muscle, fascia and tendon of right hip, subsequent encounter: Secondary | ICD-10-CM | POA: Diagnosis not present

## 2019-07-24 DIAGNOSIS — S76011D Strain of muscle, fascia and tendon of right hip, subsequent encounter: Secondary | ICD-10-CM | POA: Diagnosis not present

## 2019-07-24 DIAGNOSIS — M25551 Pain in right hip: Secondary | ICD-10-CM | POA: Diagnosis not present

## 2019-08-02 DIAGNOSIS — M25551 Pain in right hip: Secondary | ICD-10-CM | POA: Diagnosis not present

## 2019-08-02 DIAGNOSIS — S76011D Strain of muscle, fascia and tendon of right hip, subsequent encounter: Secondary | ICD-10-CM | POA: Diagnosis not present

## 2019-08-13 DIAGNOSIS — S76011D Strain of muscle, fascia and tendon of right hip, subsequent encounter: Secondary | ICD-10-CM | POA: Diagnosis not present

## 2019-08-13 DIAGNOSIS — M25551 Pain in right hip: Secondary | ICD-10-CM | POA: Diagnosis not present

## 2019-08-21 DIAGNOSIS — M25551 Pain in right hip: Secondary | ICD-10-CM | POA: Diagnosis not present

## 2019-08-21 DIAGNOSIS — S76011D Strain of muscle, fascia and tendon of right hip, subsequent encounter: Secondary | ICD-10-CM | POA: Diagnosis not present

## 2019-10-30 ENCOUNTER — Telehealth: Payer: Self-pay | Admitting: *Deleted

## 2019-10-30 NOTE — Telephone Encounter (Signed)
Pt called wanting to know if she should get tested for COVID because she has a stuffy nose; the pt denies exposure; informed pt there are community sites that do not require an MD's order, but do require an appt; pt also advised to contact her PCP for recommendations; she verbalized understanding.

## 2019-12-02 LAB — HM PAP SMEAR: HM Pap smear: NORMAL

## 2019-12-23 DIAGNOSIS — Z85828 Personal history of other malignant neoplasm of skin: Secondary | ICD-10-CM | POA: Diagnosis not present

## 2019-12-23 DIAGNOSIS — D2262 Melanocytic nevi of left upper limb, including shoulder: Secondary | ICD-10-CM | POA: Diagnosis not present

## 2019-12-23 DIAGNOSIS — D2261 Melanocytic nevi of right upper limb, including shoulder: Secondary | ICD-10-CM | POA: Diagnosis not present

## 2019-12-23 DIAGNOSIS — D2271 Melanocytic nevi of right lower limb, including hip: Secondary | ICD-10-CM | POA: Diagnosis not present

## 2019-12-31 DIAGNOSIS — Z13220 Encounter for screening for lipoid disorders: Secondary | ICD-10-CM | POA: Diagnosis not present

## 2019-12-31 DIAGNOSIS — Z1151 Encounter for screening for human papillomavirus (HPV): Secondary | ICD-10-CM | POA: Diagnosis not present

## 2019-12-31 DIAGNOSIS — Z01419 Encounter for gynecological examination (general) (routine) without abnormal findings: Secondary | ICD-10-CM | POA: Diagnosis not present

## 2019-12-31 DIAGNOSIS — Z6821 Body mass index (BMI) 21.0-21.9, adult: Secondary | ICD-10-CM | POA: Diagnosis not present

## 2019-12-31 DIAGNOSIS — Z0000 Encounter for general adult medical examination without abnormal findings: Secondary | ICD-10-CM | POA: Diagnosis not present

## 2019-12-31 DIAGNOSIS — Z1329 Encounter for screening for other suspected endocrine disorder: Secondary | ICD-10-CM | POA: Diagnosis not present

## 2019-12-31 DIAGNOSIS — Z131 Encounter for screening for diabetes mellitus: Secondary | ICD-10-CM | POA: Diagnosis not present

## 2019-12-31 DIAGNOSIS — Z130 Encounter for screening for diseases of the blood and blood-forming organs and certain disorders involving the immune mechanism: Secondary | ICD-10-CM | POA: Diagnosis not present

## 2020-01-20 ENCOUNTER — Encounter: Payer: Self-pay | Admitting: Family

## 2020-05-05 ENCOUNTER — Ambulatory Visit (INDEPENDENT_AMBULATORY_CARE_PROVIDER_SITE_OTHER): Payer: BC Managed Care – PPO | Admitting: Nurse Practitioner

## 2020-05-05 ENCOUNTER — Other Ambulatory Visit: Payer: Self-pay

## 2020-05-05 ENCOUNTER — Encounter: Payer: Self-pay | Admitting: Nurse Practitioner

## 2020-05-05 VITALS — BP 108/64 | HR 63 | Temp 97.6°F | Ht 66.5 in | Wt 128.0 lb

## 2020-05-05 DIAGNOSIS — D62 Acute posthemorrhagic anemia: Secondary | ICD-10-CM

## 2020-05-05 DIAGNOSIS — Z8639 Personal history of other endocrine, nutritional and metabolic disease: Secondary | ICD-10-CM | POA: Diagnosis not present

## 2020-05-05 DIAGNOSIS — D509 Iron deficiency anemia, unspecified: Secondary | ICD-10-CM

## 2020-05-05 DIAGNOSIS — Z0000 Encounter for general adult medical examination without abnormal findings: Secondary | ICD-10-CM | POA: Diagnosis not present

## 2020-05-05 NOTE — Progress Notes (Signed)
New Patient Office Visit  Subjective:  Patient ID: Lauren Ingram, female    DOB: Sep 13, 1986  Age: 34 y.o. MRN: [id number]  CC:  Chief Complaint  Patient presents with  . Annual Exam    CPE no pap    HPI Lauren Ingram presents to establish care with a primary care provider.  She reports a history of anemia and low vit D levels and would like her labs rechecked.  She is followed by GYN and had a recent CPE does not need a Pap test today.  She is not using any type of birth control at this time.  Her past medical history is otherwise negative.  Past Medical History:  Diagnosis Date  . Frequent headaches   . IBS (irritable bowel syndrome)   . Medical history non-contributory   . Postoperative anemia due to acute blood loss 10/12/2017  . Postpartum care following vaginal delivery (12/23) 10/23/2015    Past Surgical History:  Procedure Laterality Date  . CESAREAN SECTION    . CESAREAN SECTION N/A 10/11/2017   Procedure: Primary CESAREAN SECTION;  Surgeon: Servando Salina, MD;  Location: [facility];  Service: Obstetrics;  Laterality: N/A;  EDD: 11/06/17  . CESAREAN SECTION  2018  . NO PAST SURGERIES      Family History  Problem Relation Age of Onset  . Heart disease Maternal Grandfather   . Hypertension Maternal Grandfather   . Diabetes Maternal Grandfather   . Heart disease Paternal Grandfather   . Hypertension Paternal Grandfather   . Diabetes Paternal Grandfather   . Hypertension Maternal Grandmother   . Hypertension Paternal Grandmother     Social History   Socioeconomic History  . Marital status: Married    Spouse name: Not on file  . Number of children: Not on file  . Years of education: Not on file  . Highest education level: Not on file  Occupational History  . Occupation: Stay at home mom  Tobacco Use  . Smoking status: Never Smoker  . Smokeless tobacco: Never Used  Vaping Use  . Vaping Use: Never used  Substance and Sexual  Activity  . Alcohol use: No  . Drug use: No  . Sexual activity: Yes    Birth control/protection: None  Other Topics Concern  . Not on file  Social History Narrative  . Not on file   Social Determinants of Health   Financial Resource Strain:   . Difficulty of Paying Living Expenses:   Food Insecurity:   . Worried About Charity fundraiser in the Last Year:   . Arboriculturist in the Last Year:   Transportation Needs:   . Film/video editor (Medical):   Marland Kitchen Lack of Transportation (Non-Medical):   Physical Activity:   . Days of Exercise per Week:   . Minutes of Exercise per Session:   Stress:   . Feeling of Stress :   Social Connections:   . Frequency of Communication with Friends and Family:   . Frequency of Social Gatherings with Friends and Family:   . Attends Religious Services:   . Active Member of Clubs or Organizations:   . Attends Archivist Meetings:   Marland Kitchen Marital Status:   Intimate Partner Violence:   . Fear of Current or Ex-Partner:   . Emotionally Abused:   Marland Kitchen Physically Abused:   . Sexually Abused:     ROS Review of Systems  Constitutional: Negative for chills and fever.  HENT:  Negative for congestion.   Eyes: Negative.   Respiratory: Negative.   Cardiovascular: Negative.   Gastrointestinal: Negative.   Endocrine: Negative for cold intolerance and heat intolerance.  Genitourinary: Negative for difficulty urinating.  Musculoskeletal: Negative.   Skin: Negative.   Neurological: Negative for dizziness, seizures, syncope and headaches.  Hematological: Negative for adenopathy. Does not bruise/bleed easily.  Psychiatric/Behavioral: Negative.     Objective:   Today's Vitals: BP 108/64 (BP Location: Left Arm, Patient Position: Sitting, Cuff Size: Normal)   Pulse 63   Temp 97.6 F (36.4 C) (Oral)   Ht 5' 6.5" (1.689 m)   Wt 128 lb (58.1 kg)   SpO2 98%   BMI 20.35 kg/m   Physical Exam Vitals reviewed.  Constitutional:      Appearance:  Normal appearance. She is normal weight.  HENT:     Head: Normocephalic.     Right Ear: Tympanic membrane normal. There is no impacted cerumen.     Left Ear: Tympanic membrane normal. There is no impacted cerumen.  Eyes:     Conjunctiva/sclera: Conjunctivae normal.     Pupils: Pupils are equal, round, and reactive to light.  Cardiovascular:     Rate and Rhythm: Normal rate and regular rhythm.     Pulses: Normal pulses.     Heart sounds: Normal heart sounds.  Pulmonary:     Effort: Pulmonary effort is normal.     Breath sounds: Normal breath sounds.  Abdominal:     Palpations: Abdomen is soft.     Tenderness: There is no abdominal tenderness.  Musculoskeletal:        General: Normal range of motion.     Cervical back: Normal range of motion and neck supple.  Skin:    General: Skin is warm and dry.  Neurological:     General: No focal deficit present.     Mental Status: She is alert and oriented to person, place, and time.  Psychiatric:        Mood and Affect: Mood normal.        Behavior: Behavior normal.        Thought Content: Thought content normal.        Judgment: Judgment normal.     Assessment & Plan:   Problem List Items Addressed This Visit      Other   Iron deficiency anemia   Relevant Orders   CBC with Differential/Platelet   IBC panel   Postoperative anemia due to acute blood loss   Preventative health care - Primary   Relevant Orders   CBC with Differential/Platelet (Completed)   TSH (Completed)   Comprehensive metabolic panel (Completed)   Hemoglobin A1c (Completed)   Hepatitis C antibody (Completed)   Lipid panel (Completed)   VITAMIN D 25 Hydroxy (Vit-D Deficiency, Fractures) (Completed)   B12 and Folate Panel (Completed)   History of vitamin D deficiency      Outpatient Encounter Medications as of 05/05/2020  Medication Sig  . [DISCONTINUED] cyclobenzaprine (FLEXERIL) 5 MG tablet Take 1 tablet (5 mg total) by mouth 3 (three) times daily as  needed for muscle spasms. (Patient not taking: Reported on 05/05/2020)  . [DISCONTINUED] dicloxacillin (DYNAPEN) 500 MG capsule Take 1 capsule (500 mg total) by mouth 4 (four) times daily. (Patient not taking: Reported on 05/05/2020)   No facility-administered encounter medications on file as of 05/05/2020.  She reports she had the Tdap 03/2017 and she will be due for your next tetanus in 2023.  If you  decide to get a Covid vaccine, please contact your local pharmacy. We do recommend this vaccine.  Laboratory data: The labs look great overall. Neg screening hepatitis C, normal electrolytes, liver, kidney function, glucose, thyroid, vitamin D. B12- low normal 274  The iron is probably still a little low- for Hgb to be down. Take a MVI with iron OR pick up ferrous sulfate 325 mg and take on it e pill per day- for 2 months,may constipate. The WBC is likely transiently low and we can monitor it in 3 mos.   Follow-up: Return in about 1 year (around 05/05/2021).  This visit occurred during the SARS-CoV-2 public health emergency.  Safety protocols were in place, including screening questions prior to the visit, additional usage of staff PPE, and extensive cleaning of exam room while observing appropriate contact time as indicated for disinfecting solutions.   Denice Paradise, NP Adult Nurse Practitioner [facility] [phone]

## 2020-05-05 NOTE — Patient Instructions (Addendum)
Please go to the lab today for routine laboratory studies.  We will notify you when the results are in.  You will be due for your next tetanus in 2023.  If you decide to get a Covid vaccine, please contact your local pharmacy. We do recommend this vaccine.      Preventive Care 2-36 Years Old, Female Preventive care refers to visits with your health care provider and lifestyle choices that can promote health and wellness. This includes:  A yearly physical exam. This may also be called an annual well check.  Regular dental visits and eye exams.  Immunizations.  Screening for certain conditions.  Healthy lifestyle choices, such as eating a healthy diet, getting regular exercise, not using drugs or products that contain nicotine and tobacco, and limiting alcohol use. What can I expect for my preventive care visit? Physical exam Your health care provider will check your:  Height and weight. This may be used to calculate body mass index (BMI), which tells if you are at a healthy weight.  Heart rate and blood pressure.  Skin for abnormal spots. Counseling Your health care provider may ask you questions about your:  Alcohol, tobacco, and drug use.  Emotional well-being.  Home and relationship well-being.  Sexual activity.  Eating habits.  Work and work Statistician.  Method of birth control.  Menstrual cycle.  Pregnancy history. What immunizations do I need?  Influenza (flu) vaccine  This is recommended every year. Tetanus, diphtheria, and pertussis (Tdap) vaccine  You may need a Td booster every 10 years. Varicella (chickenpox) vaccine  You may need this if you have not been vaccinated. Human papillomavirus (HPV) vaccine  If recommended by your health care provider, you may need three doses over 6 months. Measles, mumps, and rubella (MMR) vaccine  You may need at least one dose of MMR. You may also need a second dose. Meningococcal conjugate (MenACWY)  vaccine  One dose is recommended if you are age 14-21 years and a first-year college student living in a residence hall, or if you have one of several medical conditions. You may also need additional booster doses. Pneumococcal conjugate (PCV13) vaccine  You may need this if you have certain conditions and were not previously vaccinated. Pneumococcal polysaccharide (PPSV23) vaccine  You may need one or two doses if you smoke cigarettes or if you have certain conditions. Hepatitis A vaccine  You may need this if you have certain conditions or if you travel or work in places where you may be exposed to hepatitis A. Hepatitis B vaccine  You may need this if you have certain conditions or if you travel or work in places where you may be exposed to hepatitis B. Haemophilus influenzae type b (Hib) vaccine  You may need this if you have certain conditions. You may receive vaccines as individual doses or as more than one vaccine together in one shot (combination vaccines). Talk with your health care provider about the risks and benefits of combination vaccines. What tests do I need?  Blood tests  Lipid and cholesterol levels. These may be checked every 5 years starting at age 94.  Hepatitis C test.  Hepatitis B test. Screening  Diabetes screening. This is done by checking your blood sugar (glucose) after you have not eaten for a while (fasting).  Sexually transmitted disease (STD) testing.  BRCA-related cancer screening. This may be done if you have a family history of breast, ovarian, tubal, or peritoneal cancers.  Pelvic exam and Pap  test. This may be done every 3 years starting at age 44. Starting at age 5, this may be done every 5 years if you have a Pap test in combination with an HPV test. Talk with your health care provider about your test results, treatment options, and if necessary, the need for more tests. Follow these instructions at home: Eating and drinking   Eat a  diet that includes fresh fruits and vegetables, whole grains, lean protein, and low-fat dairy.  Take vitamin and mineral supplements as recommended by your health care provider.  Do not drink alcohol if: ? Your health care provider tells you not to drink. ? You are pregnant, may be pregnant, or are planning to become pregnant.  If you drink alcohol: ? Limit how much you have to 0-1 drink a day. ? Be aware of how much alcohol is in your drink. In the [place] one drink equals one 12 oz bottle of beer (355 mL), one 5 oz glass of wine (148 mL), or one 1 oz glass of hard liquor (44 mL). Lifestyle  Take daily care of your teeth and gums.  Stay active. Exercise for at least 30 minutes on 5 or more days each week.  Do not use any products that contain nicotine or tobacco, such as cigarettes, e-cigarettes, and chewing tobacco. If you need help quitting, ask your health care provider.  If you are sexually active, practice safe sex. Use a condom or other form of birth control (contraception) in order to prevent pregnancy and STIs (sexually transmitted infections). If you plan to become pregnant, see your health care provider for a preconception visit. What's next?  Visit your health care provider once a year for a well check visit.  Ask your health care provider how often you should have your eyes and teeth checked.  Stay up to date on all vaccines. This information is not intended to replace advice given to you by your health care provider. Make sure you discuss any questions you have with your health care provider. Document Revised: 06/28/2018 Document Reviewed: 06/28/2018 Elsevier Patient Education  2020 [employer].

## 2020-05-06 LAB — LIPID PANEL
Cholesterol: 138 mg/dL (ref 0–200)
HDL: 55.8 mg/dL (ref >39.00)
LDL Cholesterol: 76 mg/dL (ref 0–99)
NonHDL: 81.75
Total CHOL/HDL Ratio: 2
Triglycerides: 29 mg/dL (ref 0.0–149.0)
VLDL: 5.8 mg/dL (ref 0.0–40.0)

## 2020-05-06 LAB — COMPREHENSIVE METABOLIC PANEL
ALT: 10 U/L (ref 0–35)
AST: 16 U/L (ref 0–37)
Albumin: 4.5 g/dL (ref 3.5–5.2)
Alkaline Phosphatase: 59 U/L (ref 39–117)
BUN: 16 mg/dL (ref 6–23)
CO2: 28 mEq/L (ref 19–32)
Calcium: 9.2 mg/dL (ref 8.4–10.5)
Chloride: 106 mEq/L (ref 96–112)
Creatinine, Ser: 0.92 mg/dL (ref 0.40–1.20)
GFR: 69.14 mL/min (ref >60.00)
Glucose, Bld: 80 mg/dL (ref 70–99)
Potassium: 4.4 mEq/L (ref 3.5–5.1)
Sodium: 141 mEq/L (ref 135–145)
Total Bilirubin: 0.4 mg/dL (ref 0.2–1.2)
Total Protein: 6.6 g/dL (ref 6.0–8.3)

## 2020-05-06 LAB — CBC WITH DIFFERENTIAL/PLATELET
Basophils Absolute: 0 10*3/uL (ref 0.0–0.1)
Basophils Relative: 0.4 % (ref 0.0–3.0)
Eosinophils Absolute: 0 10*3/uL (ref 0.0–0.7)
Eosinophils Relative: 0.9 % (ref 0.0–5.0)
HCT: 36.6 % (ref 36.0–46.0)
Hemoglobin: 11.9 g/dL — ABNORMAL LOW (ref 12.0–15.0)
Lymphocytes Relative: 33.2 % (ref 12.0–46.0)
Lymphs Abs: 1.3 10*3/uL (ref 0.7–4.0)
MCHC: 32.6 g/dL (ref 30.0–36.0)
MCV: 85.7 fl (ref 78.0–100.0)
Monocytes Absolute: 0.1 10*3/uL (ref 0.1–1.0)
Monocytes Relative: 3.4 % (ref 3.0–12.0)
Neutro Abs: 2.4 10*3/uL (ref 1.4–7.7)
Neutrophils Relative %: 62.1 % (ref 43.0–77.0)
Platelets: 151 10*3/uL (ref 150.0–400.0)
RBC: 4.27 Mil/uL (ref 3.87–5.11)
RDW: 14.7 % (ref 11.5–15.5)
WBC: 3.8 10*3/uL — ABNORMAL LOW (ref 4.0–10.5)

## 2020-05-06 LAB — HEPATITIS C ANTIBODY
Hepatitis C Ab: NONREACTIVE
SIGNAL TO CUT-OFF: 0 (ref <1.00)

## 2020-05-06 LAB — HEMOGLOBIN A1C: Hgb A1c MFr Bld: 5.4 % (ref 4.6–6.5)

## 2020-05-06 LAB — VITAMIN D 25 HYDROXY (VIT D DEFICIENCY, FRACTURES): VITD: 40.12 ng/mL (ref 30.00–100.00)

## 2020-05-06 LAB — B12 AND FOLATE PANEL
Folate: 8.2 ng/mL (ref >5.9)
Vitamin B-12: 274 pg/mL (ref 211–911)

## 2020-05-06 LAB — TSH: TSH: 0.6 u[IU]/mL (ref 0.35–4.50)

## 2020-05-08 DIAGNOSIS — Z8639 Personal history of other endocrine, nutritional and metabolic disease: Secondary | ICD-10-CM | POA: Insufficient documentation

## 2020-05-12 DIAGNOSIS — L570 Actinic keratosis: Secondary | ICD-10-CM | POA: Diagnosis not present

## 2020-05-12 DIAGNOSIS — X32XXXA Exposure to sunlight, initial encounter: Secondary | ICD-10-CM | POA: Diagnosis not present

## 2020-06-02 DIAGNOSIS — Z03818 Encounter for observation for suspected exposure to other biological agents ruled out: Secondary | ICD-10-CM | POA: Diagnosis not present

## 2020-06-02 DIAGNOSIS — Z20822 Contact with and (suspected) exposure to covid-19: Secondary | ICD-10-CM | POA: Diagnosis not present

## 2020-06-09 DIAGNOSIS — Z8616 Personal history of COVID-19: Secondary | ICD-10-CM | POA: Diagnosis not present

## 2020-06-09 DIAGNOSIS — R509 Fever, unspecified: Secondary | ICD-10-CM | POA: Diagnosis not present

## 2020-06-09 DIAGNOSIS — R05 Cough: Secondary | ICD-10-CM | POA: Diagnosis not present

## 2020-06-09 DIAGNOSIS — J189 Pneumonia, unspecified organism: Secondary | ICD-10-CM | POA: Diagnosis not present

## 2020-10-06 ENCOUNTER — Ambulatory Visit: Payer: BC Managed Care – PPO | Admitting: Gastroenterology

## 2020-10-22 ENCOUNTER — Ambulatory Visit: Payer: BC Managed Care – PPO | Admitting: Physician Assistant

## 2020-11-05 ENCOUNTER — Ambulatory Visit: Payer: BC Managed Care – PPO | Admitting: Gastroenterology

## 2020-11-18 ENCOUNTER — Encounter: Payer: Self-pay | Admitting: Gastroenterology

## 2020-11-18 ENCOUNTER — Ambulatory Visit (INDEPENDENT_AMBULATORY_CARE_PROVIDER_SITE_OTHER): Payer: BC Managed Care – PPO | Admitting: Gastroenterology

## 2020-11-18 ENCOUNTER — Other Ambulatory Visit: Payer: Self-pay

## 2020-11-18 VITALS — BP 100/60 | HR 57 | Ht 67.0 in | Wt 134.0 lb

## 2020-11-18 DIAGNOSIS — R140 Abdominal distension (gaseous): Secondary | ICD-10-CM | POA: Diagnosis not present

## 2020-11-18 DIAGNOSIS — R1013 Epigastric pain: Secondary | ICD-10-CM | POA: Diagnosis not present

## 2020-11-18 DIAGNOSIS — R194 Change in bowel habit: Secondary | ICD-10-CM

## 2020-11-18 NOTE — Patient Instructions (Signed)
If you are age 37 or older, your body mass index should be between 23-30. Your Body mass index is 20.99 kg/m. If this is out of the aforementioned range listed, please consider follow up with your Primary Care Provider.  If you are age 48 or younger, your body mass index should be between 19-25. Your Body mass index is 20.99 kg/m. If this is out of the aformentioned range listed, please consider follow up with your Primary Care Provider.   It has been recommended to you by your physician that you have a(n) Endoscopy and Colonoscopy  completed. Per your request, we did not schedule the procedure(s) today. Please contact our office at [phone] should you decide to have the procedure completed. You will be scheduled for a pre-visit and procedure at that time.  Thank you for choosing me and [facility].  Alonza Bogus, PA-C

## 2020-11-18 NOTE — Progress Notes (Signed)
11/18/2020 Tobin Chad [employer] [id number] [birth year]   HISTORY OF PRESENT ILLNESS: This is a 35 year old female who was seen by Dr. Havery Moros back in November 2017 for reported history of IBS and complains of right upper quadrant abdominal pain.  She has presumed IBS.  She has experienced right upper quadrant abdominal pain intermittently at least since 2017.  Had gallbladder work-up at that time that was unremarkable.  She presents here today with several complaints.  She says that she continues to get right upper quadrant abdominal pain.  It usually starts in the epigastrium and radiates around to the right side and into her back.  She states that back in November she was having a bout of this pain and she ran her first half marathon.  She says that after she ran she immediately became sick and vomited a large amount of black emesis.  There was no frank blood.  It only occurred on that one occasion and after all that she has had no further pain or nausea/vomiting since that time.  She denies seeing any blood in her stools or black stools following all of that.  She also continues to report altered bowel habits.  Says that she never really has any formed stools, they are always kind of mushy and with an oily/greasy appearance.  She also reports intermittent issues with a lot of gas and bloating.  She is currently having no symptoms at all.  She is not on any medications and is not a big medication person.  CBC, CMP, TSH in July 2021 were all completely normal.   Past Medical History:  Diagnosis Date  . Frequent headaches   . IBS (irritable bowel syndrome)   . Postoperative anemia due to acute blood loss 10/12/2017  . Postpartum care following vaginal delivery (12/23) 10/23/2015   Past Surgical History:  Procedure Laterality Date  . CESAREAN SECTION N/A 10/11/2017   Procedure: Primary CESAREAN SECTION;  Surgeon: Servando Salina, MD;  Location: [facility];  Service: Obstetrics;   Laterality: N/A;  EDD: 11/06/17    reports that she has never smoked. She has never used smokeless tobacco. She reports that she does not drink alcohol and does not use drugs. family history includes Diabetes in her maternal grandfather and paternal grandfather; Heart disease in her maternal grandfather and paternal grandfather; Hypertension in her maternal grandfather, maternal grandmother, paternal grandfather, and paternal grandmother. No Known Allergies    No outpatient encounter medications on file as of 11/18/2020.   No facility-administered encounter medications on file as of 11/18/2020.     REVIEW OF SYSTEMS  : All other systems reviewed and negative except where noted in the History of Present Illness.   PHYSICAL EXAM: BP 100/60   Pulse (!) 57   Ht 5\' 7"  (1.702 m)   Wt 134 lb (60.8 kg)   Breastfeeding No   BMI 20.99 kg/m  General: Well developed white female in no acute distress Head: Normocephalic and atraumatic Eyes:  Sclerae anicteric, conjunctiva pink. Ears: Normal auditory acuity Lungs: Clear throughout to auscultation; no W/R/R. Heart: Regular rate and rhythm; no M/R/G. Abdomen: Soft, non-distended.  BS present.  Non-tender. Rectal:  Will be done at the time of colonoscopy. Musculoskeletal: Symmetrical with no gross deformities  Skin: No lesions on visible extremities Extremities: No edema  Neurological: Alert oriented x 4, grossly non-focal Psychological:  Alert and cooperative. Normal mood and affect  ASSESSMENT AND PLAN: *Epigastric abdominal: Reports intermittent episodes of epigastric abdominal pain  that usually radiates around the right side of her upper abdomen and into her back.  Had gallbladder evaluation previously that was unremarkable.  She had this pain back in November and then after running a half marathon had an episode of large volume black emesis.  No recurrence since that time.  She's also had no pain since that time. *Abnormal bowel habits: She  says rarely ever formed stool, always somewhat mushy and oily/greasy appearing.  No rectal bleeding.    **We will plan for EGD and colonoscopy to rule out celiac disease, esophagitis, peptic ulcer disease, IBD, etc.  She would like to proceed with those, but is going to call back to schedule after she discussed with her husband, etc.   CC:  Marval Regal, NP

## 2020-11-19 NOTE — Progress Notes (Signed)
Agree with assessment and plan as outlined. In the interim I would recommend trial of omeprazole 40mg  once daily in case she had vomited bloody emesis previously, empirically treat for PUD, if you can contact her and let her know. Thanks

## 2020-12-07 DIAGNOSIS — Z01419 Encounter for gynecological examination (general) (routine) without abnormal findings: Secondary | ICD-10-CM | POA: Diagnosis not present

## 2020-12-22 DIAGNOSIS — Z85828 Personal history of other malignant neoplasm of skin: Secondary | ICD-10-CM | POA: Diagnosis not present

## 2020-12-22 DIAGNOSIS — D225 Melanocytic nevi of trunk: Secondary | ICD-10-CM | POA: Diagnosis not present

## 2020-12-22 DIAGNOSIS — D2262 Melanocytic nevi of left upper limb, including shoulder: Secondary | ICD-10-CM | POA: Diagnosis not present

## 2020-12-22 DIAGNOSIS — D2261 Melanocytic nevi of right upper limb, including shoulder: Secondary | ICD-10-CM | POA: Diagnosis not present

## 2021-10-15 DIAGNOSIS — E538 Deficiency of other specified B group vitamins: Secondary | ICD-10-CM | POA: Diagnosis not present

## 2021-10-15 DIAGNOSIS — Z13220 Encounter for screening for lipoid disorders: Secondary | ICD-10-CM | POA: Diagnosis not present

## 2021-10-15 DIAGNOSIS — Z131 Encounter for screening for diabetes mellitus: Secondary | ICD-10-CM | POA: Diagnosis not present

## 2021-10-15 DIAGNOSIS — Z0000 Encounter for general adult medical examination without abnormal findings: Secondary | ICD-10-CM | POA: Diagnosis not present

## 2021-10-15 DIAGNOSIS — D61818 Other pancytopenia: Secondary | ICD-10-CM | POA: Diagnosis not present

## 2021-10-15 DIAGNOSIS — E559 Vitamin D deficiency, unspecified: Secondary | ICD-10-CM | POA: Diagnosis not present

## 2021-10-18 DIAGNOSIS — R399 Unspecified symptoms and signs involving the genitourinary system: Secondary | ICD-10-CM | POA: Diagnosis not present

## 2021-11-03 DIAGNOSIS — M222X1 Patellofemoral disorders, right knee: Secondary | ICD-10-CM | POA: Diagnosis not present

## 2021-11-23 DIAGNOSIS — M25561 Pain in right knee: Secondary | ICD-10-CM | POA: Diagnosis not present

## 2021-11-23 DIAGNOSIS — M7631 Iliotibial band syndrome, right leg: Secondary | ICD-10-CM | POA: Diagnosis not present

## 2021-11-30 DIAGNOSIS — M7631 Iliotibial band syndrome, right leg: Secondary | ICD-10-CM | POA: Diagnosis not present

## 2021-11-30 DIAGNOSIS — M25561 Pain in right knee: Secondary | ICD-10-CM | POA: Diagnosis not present

## 2021-12-06 DIAGNOSIS — M7631 Iliotibial band syndrome, right leg: Secondary | ICD-10-CM | POA: Diagnosis not present

## 2021-12-06 DIAGNOSIS — M25561 Pain in right knee: Secondary | ICD-10-CM | POA: Diagnosis not present

## 2021-12-08 DIAGNOSIS — Z01419 Encounter for gynecological examination (general) (routine) without abnormal findings: Secondary | ICD-10-CM | POA: Diagnosis not present

## 2021-12-08 DIAGNOSIS — R87610 Atypical squamous cells of undetermined significance on cytologic smear of cervix (ASC-US): Secondary | ICD-10-CM | POA: Diagnosis not present

## 2021-12-13 DIAGNOSIS — M25561 Pain in right knee: Secondary | ICD-10-CM | POA: Diagnosis not present

## 2021-12-13 DIAGNOSIS — M7631 Iliotibial band syndrome, right leg: Secondary | ICD-10-CM | POA: Diagnosis not present

## 2021-12-21 DIAGNOSIS — D485 Neoplasm of uncertain behavior of skin: Secondary | ICD-10-CM | POA: Diagnosis not present

## 2021-12-21 DIAGNOSIS — L821 Other seborrheic keratosis: Secondary | ICD-10-CM | POA: Diagnosis not present

## 2021-12-21 DIAGNOSIS — D2262 Melanocytic nevi of left upper limb, including shoulder: Secondary | ICD-10-CM | POA: Diagnosis not present

## 2021-12-21 DIAGNOSIS — D225 Melanocytic nevi of trunk: Secondary | ICD-10-CM | POA: Diagnosis not present

## 2021-12-21 DIAGNOSIS — Z85828 Personal history of other malignant neoplasm of skin: Secondary | ICD-10-CM | POA: Diagnosis not present

## 2021-12-21 DIAGNOSIS — D2261 Melanocytic nevi of right upper limb, including shoulder: Secondary | ICD-10-CM | POA: Diagnosis not present

## 2021-12-27 DIAGNOSIS — M25561 Pain in right knee: Secondary | ICD-10-CM | POA: Diagnosis not present

## 2021-12-27 DIAGNOSIS — M7631 Iliotibial band syndrome, right leg: Secondary | ICD-10-CM | POA: Diagnosis not present

## 2022-01-17 DIAGNOSIS — M7631 Iliotibial band syndrome, right leg: Secondary | ICD-10-CM | POA: Diagnosis not present

## 2022-01-17 DIAGNOSIS — M25561 Pain in right knee: Secondary | ICD-10-CM | POA: Diagnosis not present

## 2022-02-02 DIAGNOSIS — R87610 Atypical squamous cells of undetermined significance on cytologic smear of cervix (ASC-US): Secondary | ICD-10-CM | POA: Diagnosis not present

## 2022-02-02 DIAGNOSIS — N870 Mild cervical dysplasia: Secondary | ICD-10-CM | POA: Diagnosis not present

## 2022-02-02 DIAGNOSIS — N72 Inflammatory disease of cervix uteri: Secondary | ICD-10-CM | POA: Diagnosis not present

## 2022-03-04 DIAGNOSIS — M25561 Pain in right knee: Secondary | ICD-10-CM | POA: Diagnosis not present

## 2022-03-04 DIAGNOSIS — M7631 Iliotibial band syndrome, right leg: Secondary | ICD-10-CM | POA: Diagnosis not present

## 2022-03-08 DIAGNOSIS — M7631 Iliotibial band syndrome, right leg: Secondary | ICD-10-CM | POA: Diagnosis not present

## 2022-03-08 DIAGNOSIS — M25561 Pain in right knee: Secondary | ICD-10-CM | POA: Diagnosis not present

## 2022-04-26 DIAGNOSIS — M25561 Pain in right knee: Secondary | ICD-10-CM | POA: Diagnosis not present

## 2022-04-26 DIAGNOSIS — M7631 Iliotibial band syndrome, right leg: Secondary | ICD-10-CM | POA: Diagnosis not present

## 2022-05-02 DIAGNOSIS — X32XXXA Exposure to sunlight, initial encounter: Secondary | ICD-10-CM | POA: Diagnosis not present

## 2022-05-02 DIAGNOSIS — L570 Actinic keratosis: Secondary | ICD-10-CM | POA: Diagnosis not present

## 2022-05-25 DIAGNOSIS — S76311A Strain of muscle, fascia and tendon of the posterior muscle group at thigh level, right thigh, initial encounter: Secondary | ICD-10-CM | POA: Diagnosis not present

## 2022-07-18 DIAGNOSIS — M62830 Muscle spasm of back: Secondary | ICD-10-CM | POA: Diagnosis not present

## 2022-07-18 DIAGNOSIS — M62452 Contracture of muscle, left thigh: Secondary | ICD-10-CM | POA: Diagnosis not present

## 2022-07-18 DIAGNOSIS — M9903 Segmental and somatic dysfunction of lumbar region: Secondary | ICD-10-CM | POA: Diagnosis not present

## 2022-07-18 DIAGNOSIS — M9906 Segmental and somatic dysfunction of lower extremity: Secondary | ICD-10-CM | POA: Diagnosis not present

## 2022-07-18 DIAGNOSIS — M9902 Segmental and somatic dysfunction of thoracic region: Secondary | ICD-10-CM | POA: Diagnosis not present

## 2022-07-18 DIAGNOSIS — S39012A Strain of muscle, fascia and tendon of lower back, initial encounter: Secondary | ICD-10-CM | POA: Diagnosis not present

## 2022-07-21 DIAGNOSIS — M62830 Muscle spasm of back: Secondary | ICD-10-CM | POA: Diagnosis not present

## 2022-07-21 DIAGNOSIS — S39012A Strain of muscle, fascia and tendon of lower back, initial encounter: Secondary | ICD-10-CM | POA: Diagnosis not present

## 2022-07-21 DIAGNOSIS — M9903 Segmental and somatic dysfunction of lumbar region: Secondary | ICD-10-CM | POA: Diagnosis not present

## 2022-07-21 DIAGNOSIS — M62452 Contracture of muscle, left thigh: Secondary | ICD-10-CM | POA: Diagnosis not present

## 2022-07-21 DIAGNOSIS — M9902 Segmental and somatic dysfunction of thoracic region: Secondary | ICD-10-CM | POA: Diagnosis not present

## 2022-07-21 DIAGNOSIS — M9906 Segmental and somatic dysfunction of lower extremity: Secondary | ICD-10-CM | POA: Diagnosis not present

## 2022-07-25 DIAGNOSIS — M62452 Contracture of muscle, left thigh: Secondary | ICD-10-CM | POA: Diagnosis not present

## 2022-07-25 DIAGNOSIS — M9906 Segmental and somatic dysfunction of lower extremity: Secondary | ICD-10-CM | POA: Diagnosis not present

## 2022-07-25 DIAGNOSIS — M9903 Segmental and somatic dysfunction of lumbar region: Secondary | ICD-10-CM | POA: Diagnosis not present

## 2022-07-25 DIAGNOSIS — S39012A Strain of muscle, fascia and tendon of lower back, initial encounter: Secondary | ICD-10-CM | POA: Diagnosis not present

## 2022-07-25 DIAGNOSIS — M62830 Muscle spasm of back: Secondary | ICD-10-CM | POA: Diagnosis not present

## 2022-07-25 DIAGNOSIS — M9902 Segmental and somatic dysfunction of thoracic region: Secondary | ICD-10-CM | POA: Diagnosis not present

## 2022-07-28 DIAGNOSIS — M62830 Muscle spasm of back: Secondary | ICD-10-CM | POA: Diagnosis not present

## 2022-07-28 DIAGNOSIS — M62452 Contracture of muscle, left thigh: Secondary | ICD-10-CM | POA: Diagnosis not present

## 2022-07-28 DIAGNOSIS — M9903 Segmental and somatic dysfunction of lumbar region: Secondary | ICD-10-CM | POA: Diagnosis not present

## 2022-07-28 DIAGNOSIS — S39012A Strain of muscle, fascia and tendon of lower back, initial encounter: Secondary | ICD-10-CM | POA: Diagnosis not present

## 2022-07-28 DIAGNOSIS — M9902 Segmental and somatic dysfunction of thoracic region: Secondary | ICD-10-CM | POA: Diagnosis not present

## 2022-07-28 DIAGNOSIS — M9906 Segmental and somatic dysfunction of lower extremity: Secondary | ICD-10-CM | POA: Diagnosis not present

## 2022-08-01 DIAGNOSIS — M9902 Segmental and somatic dysfunction of thoracic region: Secondary | ICD-10-CM | POA: Diagnosis not present

## 2022-08-01 DIAGNOSIS — M62830 Muscle spasm of back: Secondary | ICD-10-CM | POA: Diagnosis not present

## 2022-08-01 DIAGNOSIS — S39012A Strain of muscle, fascia and tendon of lower back, initial encounter: Secondary | ICD-10-CM | POA: Diagnosis not present

## 2022-08-01 DIAGNOSIS — M9906 Segmental and somatic dysfunction of lower extremity: Secondary | ICD-10-CM | POA: Diagnosis not present

## 2022-08-01 DIAGNOSIS — M9903 Segmental and somatic dysfunction of lumbar region: Secondary | ICD-10-CM | POA: Diagnosis not present

## 2022-08-01 DIAGNOSIS — M62452 Contracture of muscle, left thigh: Secondary | ICD-10-CM | POA: Diagnosis not present

## 2022-08-08 DIAGNOSIS — S39012A Strain of muscle, fascia and tendon of lower back, initial encounter: Secondary | ICD-10-CM | POA: Diagnosis not present

## 2022-08-08 DIAGNOSIS — M9902 Segmental and somatic dysfunction of thoracic region: Secondary | ICD-10-CM | POA: Diagnosis not present

## 2022-08-08 DIAGNOSIS — M9906 Segmental and somatic dysfunction of lower extremity: Secondary | ICD-10-CM | POA: Diagnosis not present

## 2022-08-08 DIAGNOSIS — M9903 Segmental and somatic dysfunction of lumbar region: Secondary | ICD-10-CM | POA: Diagnosis not present

## 2022-08-08 DIAGNOSIS — M62830 Muscle spasm of back: Secondary | ICD-10-CM | POA: Diagnosis not present

## 2022-08-08 DIAGNOSIS — M62452 Contracture of muscle, left thigh: Secondary | ICD-10-CM | POA: Diagnosis not present

## 2022-08-11 DIAGNOSIS — M9902 Segmental and somatic dysfunction of thoracic region: Secondary | ICD-10-CM | POA: Diagnosis not present

## 2022-08-11 DIAGNOSIS — M62452 Contracture of muscle, left thigh: Secondary | ICD-10-CM | POA: Diagnosis not present

## 2022-08-11 DIAGNOSIS — S39012A Strain of muscle, fascia and tendon of lower back, initial encounter: Secondary | ICD-10-CM | POA: Diagnosis not present

## 2022-08-11 DIAGNOSIS — M62830 Muscle spasm of back: Secondary | ICD-10-CM | POA: Diagnosis not present

## 2022-08-11 DIAGNOSIS — M9903 Segmental and somatic dysfunction of lumbar region: Secondary | ICD-10-CM | POA: Diagnosis not present

## 2022-08-11 DIAGNOSIS — M9906 Segmental and somatic dysfunction of lower extremity: Secondary | ICD-10-CM | POA: Diagnosis not present

## 2022-08-15 DIAGNOSIS — M9906 Segmental and somatic dysfunction of lower extremity: Secondary | ICD-10-CM | POA: Diagnosis not present

## 2022-08-15 DIAGNOSIS — M9902 Segmental and somatic dysfunction of thoracic region: Secondary | ICD-10-CM | POA: Diagnosis not present

## 2022-08-15 DIAGNOSIS — M62830 Muscle spasm of back: Secondary | ICD-10-CM | POA: Diagnosis not present

## 2022-08-15 DIAGNOSIS — M62452 Contracture of muscle, left thigh: Secondary | ICD-10-CM | POA: Diagnosis not present

## 2022-08-15 DIAGNOSIS — S39012A Strain of muscle, fascia and tendon of lower back, initial encounter: Secondary | ICD-10-CM | POA: Diagnosis not present

## 2022-08-15 DIAGNOSIS — M9903 Segmental and somatic dysfunction of lumbar region: Secondary | ICD-10-CM | POA: Diagnosis not present

## 2022-08-22 DIAGNOSIS — M9903 Segmental and somatic dysfunction of lumbar region: Secondary | ICD-10-CM | POA: Diagnosis not present

## 2022-08-22 DIAGNOSIS — M62452 Contracture of muscle, left thigh: Secondary | ICD-10-CM | POA: Diagnosis not present

## 2022-08-22 DIAGNOSIS — S39012A Strain of muscle, fascia and tendon of lower back, initial encounter: Secondary | ICD-10-CM | POA: Diagnosis not present

## 2022-08-22 DIAGNOSIS — M9906 Segmental and somatic dysfunction of lower extremity: Secondary | ICD-10-CM | POA: Diagnosis not present

## 2022-08-22 DIAGNOSIS — M62830 Muscle spasm of back: Secondary | ICD-10-CM | POA: Diagnosis not present

## 2022-08-22 DIAGNOSIS — M9902 Segmental and somatic dysfunction of thoracic region: Secondary | ICD-10-CM | POA: Diagnosis not present

## 2022-08-29 DIAGNOSIS — M62452 Contracture of muscle, left thigh: Secondary | ICD-10-CM | POA: Diagnosis not present

## 2022-08-29 DIAGNOSIS — M9903 Segmental and somatic dysfunction of lumbar region: Secondary | ICD-10-CM | POA: Diagnosis not present

## 2022-08-29 DIAGNOSIS — M9906 Segmental and somatic dysfunction of lower extremity: Secondary | ICD-10-CM | POA: Diagnosis not present

## 2022-08-29 DIAGNOSIS — M62830 Muscle spasm of back: Secondary | ICD-10-CM | POA: Diagnosis not present

## 2022-08-29 DIAGNOSIS — M9902 Segmental and somatic dysfunction of thoracic region: Secondary | ICD-10-CM | POA: Diagnosis not present

## 2022-08-29 DIAGNOSIS — S39012A Strain of muscle, fascia and tendon of lower back, initial encounter: Secondary | ICD-10-CM | POA: Diagnosis not present

## 2022-08-30 DIAGNOSIS — N898 Other specified noninflammatory disorders of vagina: Secondary | ICD-10-CM | POA: Diagnosis not present

## 2022-08-30 DIAGNOSIS — B3731 Acute candidiasis of vulva and vagina: Secondary | ICD-10-CM | POA: Diagnosis not present

## 2022-09-05 DIAGNOSIS — M62452 Contracture of muscle, left thigh: Secondary | ICD-10-CM | POA: Diagnosis not present

## 2022-09-05 DIAGNOSIS — M9902 Segmental and somatic dysfunction of thoracic region: Secondary | ICD-10-CM | POA: Diagnosis not present

## 2022-09-05 DIAGNOSIS — M62830 Muscle spasm of back: Secondary | ICD-10-CM | POA: Diagnosis not present

## 2022-09-05 DIAGNOSIS — S39012A Strain of muscle, fascia and tendon of lower back, initial encounter: Secondary | ICD-10-CM | POA: Diagnosis not present

## 2022-09-05 DIAGNOSIS — M9906 Segmental and somatic dysfunction of lower extremity: Secondary | ICD-10-CM | POA: Diagnosis not present

## 2022-09-05 DIAGNOSIS — M9903 Segmental and somatic dysfunction of lumbar region: Secondary | ICD-10-CM | POA: Diagnosis not present

## 2022-09-08 DIAGNOSIS — M62830 Muscle spasm of back: Secondary | ICD-10-CM | POA: Diagnosis not present

## 2022-09-08 DIAGNOSIS — M9906 Segmental and somatic dysfunction of lower extremity: Secondary | ICD-10-CM | POA: Diagnosis not present

## 2022-09-08 DIAGNOSIS — S39012A Strain of muscle, fascia and tendon of lower back, initial encounter: Secondary | ICD-10-CM | POA: Diagnosis not present

## 2022-09-08 DIAGNOSIS — M9903 Segmental and somatic dysfunction of lumbar region: Secondary | ICD-10-CM | POA: Diagnosis not present

## 2022-09-08 DIAGNOSIS — M9902 Segmental and somatic dysfunction of thoracic region: Secondary | ICD-10-CM | POA: Diagnosis not present

## 2022-09-08 DIAGNOSIS — M62452 Contracture of muscle, left thigh: Secondary | ICD-10-CM | POA: Diagnosis not present

## 2022-09-12 DIAGNOSIS — M62830 Muscle spasm of back: Secondary | ICD-10-CM | POA: Diagnosis not present

## 2022-09-12 DIAGNOSIS — S39012A Strain of muscle, fascia and tendon of lower back, initial encounter: Secondary | ICD-10-CM | POA: Diagnosis not present

## 2022-09-12 DIAGNOSIS — M9903 Segmental and somatic dysfunction of lumbar region: Secondary | ICD-10-CM | POA: Diagnosis not present

## 2022-09-12 DIAGNOSIS — M9906 Segmental and somatic dysfunction of lower extremity: Secondary | ICD-10-CM | POA: Diagnosis not present

## 2022-09-12 DIAGNOSIS — M62452 Contracture of muscle, left thigh: Secondary | ICD-10-CM | POA: Diagnosis not present

## 2022-09-12 DIAGNOSIS — M9902 Segmental and somatic dysfunction of thoracic region: Secondary | ICD-10-CM | POA: Diagnosis not present

## 2022-09-26 DIAGNOSIS — M9903 Segmental and somatic dysfunction of lumbar region: Secondary | ICD-10-CM | POA: Diagnosis not present

## 2022-09-26 DIAGNOSIS — S39012A Strain of muscle, fascia and tendon of lower back, initial encounter: Secondary | ICD-10-CM | POA: Diagnosis not present

## 2022-09-26 DIAGNOSIS — M9902 Segmental and somatic dysfunction of thoracic region: Secondary | ICD-10-CM | POA: Diagnosis not present

## 2022-09-26 DIAGNOSIS — M9906 Segmental and somatic dysfunction of lower extremity: Secondary | ICD-10-CM | POA: Diagnosis not present

## 2022-09-26 DIAGNOSIS — M62830 Muscle spasm of back: Secondary | ICD-10-CM | POA: Diagnosis not present

## 2022-09-26 DIAGNOSIS — M62452 Contracture of muscle, left thigh: Secondary | ICD-10-CM | POA: Diagnosis not present

## 2022-10-18 DIAGNOSIS — J101 Influenza due to other identified influenza virus with other respiratory manifestations: Secondary | ICD-10-CM | POA: Diagnosis not present

## 2022-10-18 DIAGNOSIS — J028 Acute pharyngitis due to other specified organisms: Secondary | ICD-10-CM | POA: Diagnosis not present

## 2022-10-18 DIAGNOSIS — Z20818 Contact with and (suspected) exposure to other bacterial communicable diseases: Secondary | ICD-10-CM | POA: Diagnosis not present

## 2022-10-18 DIAGNOSIS — Z03818 Encounter for observation for suspected exposure to other biological agents ruled out: Secondary | ICD-10-CM | POA: Diagnosis not present

## 2022-10-27 DIAGNOSIS — M9903 Segmental and somatic dysfunction of lumbar region: Secondary | ICD-10-CM | POA: Diagnosis not present

## 2022-10-27 DIAGNOSIS — M62830 Muscle spasm of back: Secondary | ICD-10-CM | POA: Diagnosis not present

## 2022-10-27 DIAGNOSIS — M9906 Segmental and somatic dysfunction of lower extremity: Secondary | ICD-10-CM | POA: Diagnosis not present

## 2022-10-27 DIAGNOSIS — M9902 Segmental and somatic dysfunction of thoracic region: Secondary | ICD-10-CM | POA: Diagnosis not present

## 2022-10-27 DIAGNOSIS — M62452 Contracture of muscle, left thigh: Secondary | ICD-10-CM | POA: Diagnosis not present

## 2022-10-27 DIAGNOSIS — S39012A Strain of muscle, fascia and tendon of lower back, initial encounter: Secondary | ICD-10-CM | POA: Diagnosis not present

## 2024-03-09 ENCOUNTER — Other Ambulatory Visit
Admission: RE | Admit: 2024-03-09 | Discharge: 2024-03-09 | Disposition: A | Discharge status: Home / Self Care | Source: Ambulatory Visit | Attending: Family Medicine | Admitting: Family Medicine

## 2024-03-09 DIAGNOSIS — R590 Localized enlarged lymph nodes: Secondary | ICD-10-CM | POA: Diagnosis present

## 2024-03-09 DIAGNOSIS — S46811A Strain of other muscles, fascia and tendons at shoulder and upper arm level, right arm, initial encounter: Secondary | ICD-10-CM | POA: Diagnosis present

## 2024-03-09 DIAGNOSIS — J020 Streptococcal pharyngitis: Secondary | ICD-10-CM | POA: Diagnosis present

## 2024-03-09 DIAGNOSIS — R519 Headache, unspecified: Secondary | ICD-10-CM | POA: Insufficient documentation

## 2024-03-09 LAB — BASIC METABOLIC PANEL WITH GFR
Anion gap: 10 (ref 5–15)
BUN: 14 mg/dL (ref 6–20)
CO2: 25 mmol/L (ref 22–32)
Calcium: 9.4 mg/dL (ref 8.9–10.3)
Chloride: 103 mmol/L (ref 98–111)
Creatinine, Ser: 0.8 mg/dL (ref 0.44–1.00)
GFR, Estimated: >60 mL/min (ref >60)
Glucose, Bld: 89 mg/dL (ref 70–99)
Potassium: 4.2 mmol/L (ref 3.5–5.1)
Sodium: 138 mmol/L (ref 135–145)
